# Patient Record
Sex: Female | Born: 1940 | Race: White | Hispanic: No | Marital: Married | Smoking: Never smoker
Health system: Southern US, Community
[De-identification: ages and names within clinical notes are randomized; demographics above are authoritative.]

## PROBLEM LIST (undated history)

## (undated) DIAGNOSIS — G8929 Other chronic pain: Secondary | ICD-10-CM

## (undated) DIAGNOSIS — E7800 Pure hypercholesterolemia, unspecified: Secondary | ICD-10-CM

## (undated) DIAGNOSIS — M549 Dorsalgia, unspecified: Secondary | ICD-10-CM

## (undated) DIAGNOSIS — M1990 Unspecified osteoarthritis, unspecified site: Secondary | ICD-10-CM

## (undated) HISTORY — PX: CHOLECYSTECTOMY: SHX55

## (undated) HISTORY — PX: BACK SURGERY: SHX140

## (undated) HISTORY — PX: EYE SURGERY: SHX253

## (undated) HISTORY — PX: ABDOMINAL HYSTERECTOMY: SHX81

---

## 2004-09-30 ENCOUNTER — Ambulatory Visit: Payer: Self-pay | Admitting: Unknown Physician Specialty

## 2005-11-10 ENCOUNTER — Ambulatory Visit: Payer: Self-pay | Admitting: Unknown Physician Specialty

## 2006-12-12 ENCOUNTER — Ambulatory Visit: Payer: Self-pay | Admitting: Unknown Physician Specialty

## 2008-01-01 ENCOUNTER — Ambulatory Visit: Payer: Self-pay | Admitting: Unknown Physician Specialty

## 2008-02-28 ENCOUNTER — Ambulatory Visit: Payer: Self-pay | Admitting: Gastroenterology

## 2009-02-06 ENCOUNTER — Ambulatory Visit: Payer: Self-pay | Admitting: Unknown Physician Specialty

## 2009-06-02 ENCOUNTER — Ambulatory Visit: Payer: Self-pay | Admitting: Unknown Physician Specialty

## 2009-09-02 ENCOUNTER — Ambulatory Visit: Payer: Self-pay | Admitting: Unknown Physician Specialty

## 2009-09-03 ENCOUNTER — Emergency Department: Payer: Self-pay | Admitting: Emergency Medicine

## 2009-09-23 ENCOUNTER — Ambulatory Visit: Payer: Self-pay | Admitting: Unknown Physician Specialty

## 2009-09-28 ENCOUNTER — Emergency Department: Payer: Self-pay | Admitting: Emergency Medicine

## 2010-01-26 ENCOUNTER — Ambulatory Visit: Payer: Self-pay | Admitting: Ophthalmology

## 2010-02-09 ENCOUNTER — Ambulatory Visit: Payer: Self-pay | Admitting: Ophthalmology

## 2010-03-01 ENCOUNTER — Ambulatory Visit: Payer: Self-pay | Admitting: Unknown Physician Specialty

## 2010-03-23 ENCOUNTER — Ambulatory Visit: Payer: Self-pay | Admitting: Ophthalmology

## 2011-07-06 ENCOUNTER — Ambulatory Visit: Payer: Self-pay | Admitting: Unknown Physician Specialty

## 2012-07-23 ENCOUNTER — Ambulatory Visit: Payer: Self-pay | Admitting: Physician Assistant

## 2012-07-26 ENCOUNTER — Ambulatory Visit: Payer: Self-pay | Admitting: Physician Assistant

## 2013-07-29 ENCOUNTER — Ambulatory Visit: Payer: Self-pay | Admitting: Physician Assistant

## 2013-10-28 ENCOUNTER — Ambulatory Visit: Payer: Self-pay | Admitting: Physician Assistant

## 2013-12-06 ENCOUNTER — Ambulatory Visit: Payer: Self-pay | Admitting: Physician Assistant

## 2013-12-16 DIAGNOSIS — M5416 Radiculopathy, lumbar region: Secondary | ICD-10-CM | POA: Insufficient documentation

## 2013-12-16 DIAGNOSIS — M51369 Other intervertebral disc degeneration, lumbar region without mention of lumbar back pain or lower extremity pain: Secondary | ICD-10-CM | POA: Insufficient documentation

## 2014-06-05 DIAGNOSIS — M8580 Other specified disorders of bone density and structure, unspecified site: Secondary | ICD-10-CM | POA: Insufficient documentation

## 2014-08-05 ENCOUNTER — Ambulatory Visit: Payer: Self-pay | Admitting: Physician Assistant

## 2015-07-24 ENCOUNTER — Emergency Department
Admission: EM | Admit: 2015-07-24 | Discharge: 2015-07-24 | Disposition: A | Discharge status: Home / Self Care | Payer: Medicare Other | Attending: Emergency Medicine | Admitting: Emergency Medicine

## 2015-07-24 ENCOUNTER — Encounter: Payer: Self-pay | Admitting: Emergency Medicine

## 2015-07-24 ENCOUNTER — Emergency Department: Payer: Medicare Other

## 2015-07-24 DIAGNOSIS — Y9389 Activity, other specified: Secondary | ICD-10-CM | POA: Diagnosis not present

## 2015-07-24 DIAGNOSIS — S0181XA Laceration without foreign body of other part of head, initial encounter: Secondary | ICD-10-CM

## 2015-07-24 DIAGNOSIS — S8001XA Contusion of right knee, initial encounter: Secondary | ICD-10-CM | POA: Diagnosis not present

## 2015-07-24 DIAGNOSIS — Y92009 Unspecified place in unspecified non-institutional (private) residence as the place of occurrence of the external cause: Secondary | ICD-10-CM | POA: Diagnosis not present

## 2015-07-24 DIAGNOSIS — S01112A Laceration without foreign body of left eyelid and periocular area, initial encounter: Secondary | ICD-10-CM | POA: Diagnosis not present

## 2015-07-24 DIAGNOSIS — W07XXXA Fall from chair, initial encounter: Secondary | ICD-10-CM | POA: Insufficient documentation

## 2015-07-24 DIAGNOSIS — Y998 Other external cause status: Secondary | ICD-10-CM | POA: Insufficient documentation

## 2015-07-24 DIAGNOSIS — S0093XA Contusion of unspecified part of head, initial encounter: Secondary | ICD-10-CM

## 2015-07-24 DIAGNOSIS — W08XXXA Fall from other furniture, initial encounter: Secondary | ICD-10-CM

## 2015-07-24 HISTORY — DX: Unspecified osteoarthritis, unspecified site: M19.90

## 2015-07-24 HISTORY — DX: Dorsalgia, unspecified: M54.9

## 2015-07-24 HISTORY — DX: Other chronic pain: G89.29

## 2015-07-24 HISTORY — DX: Pure hypercholesterolemia, unspecified: E78.00

## 2015-07-24 MED ORDER — LIDOCAINE-EPINEPHRINE (PF) 1 %-1:200000 IJ SOLN
30.0000 mL | Freq: Once | INTRAMUSCULAR | Status: DC
  Filled 2015-07-24: qty 30

## 2015-07-24 NOTE — ED Notes (Signed)
Standing in chair and fell off of chair, has lac to left forehead, no loc

## 2015-07-24 NOTE — ED Notes (Signed)
Pt was standing on chair trying to hang curtain and lost balance.  Hit head and right knee.  Pain to both. No LOC.  No blood thinners.

## 2015-07-24 NOTE — Discharge Instructions (Signed)
Facial Laceration ° A facial laceration is a cut on the face. These injuries can be painful and cause bleeding. Lacerations usually heal quickly, but they need special care to reduce scarring. °DIAGNOSIS  °Your health care provider will take a medical history, ask for details about how the injury occurred, and examine the wound to determine how deep the cut is. °TREATMENT  °Some facial lacerations may not require closure. Others may not be able to be closed because of an increased risk of infection. The risk of infection and the chance for successful closure will depend on various factors, including the amount of time since the injury occurred. °The wound may be cleaned to help prevent infection. If closure is appropriate, pain medicines may be given if needed. Your health care provider will use stitches (sutures), wound glue (adhesive), or skin adhesive strips to repair the laceration. These tools bring the skin edges together to allow for faster healing and a better cosmetic outcome. If needed, you may also be given a tetanus shot. °HOME CARE INSTRUCTIONS °· Only take over-the-counter or prescription medicines as directed by your health care provider. °· Follow your health care provider's instructions for wound care. These instructions will vary depending on the technique used for closing the wound. °For Sutures: °· Keep the wound clean and dry.   °· If you were given a bandage (dressing), you should change it at least once a day. Also change the dressing if it becomes wet or dirty, or as directed by your health care provider.   °· Wash the wound with soap and water 2 times a day. Rinse the wound off with water to remove all soap. Pat the wound dry with a clean towel.   °· After cleaning, apply a thin layer of the antibiotic ointment recommended by your health care provider. This will help prevent infection and keep the dressing from sticking.   °· You may shower as usual after the first 24 hours. Do not soak the  wound in water until the sutures are removed.   °· Get your sutures removed as directed by your health care provider. With facial lacerations, sutures should usually be taken out after 4-5 days to avoid stitch marks.   °· Wait a few days after your sutures are removed before applying any makeup. °For Skin Adhesive Strips: °· Keep the wound clean and dry.   °· Do not get the skin adhesive strips wet. You may bathe carefully, using caution to keep the wound dry.   °· If the wound gets wet, pat it dry with a clean towel.   °· Skin adhesive strips will fall off on their own. You may trim the strips as the wound heals. Do not remove skin adhesive strips that are still stuck to the wound. They will fall off in time.   °For Wound Adhesive: °· You may briefly wet your wound in the shower or bath. Do not soak or scrub the wound. Do not swim. Avoid periods of heavy sweating until the skin adhesive has fallen off on its own. After showering or bathing, gently pat the wound dry with a clean towel.   °· Do not apply liquid medicine, cream medicine, ointment medicine, or makeup to your wound while the skin adhesive is in place. This may loosen the film before your wound is healed.   °· If a dressing is placed over the wound, be careful not to apply tape directly over the skin adhesive. This may cause the adhesive to be pulled off before the wound is healed.   °· Avoid   prolonged exposure to sunlight or tanning lamps while the skin adhesive is in place.  The skin adhesive will usually remain in place for 5-10 days, then naturally fall off the skin. Do not pick at the adhesive film.  After Healing: Once the wound has healed, cover the wound with sunscreen during the day for 1 full year. This can help minimize scarring. Exposure to ultraviolet light in the first year will darken the scar. It can take 1-2 years for the scar to lose its redness and to heal completely.  SEEK MEDICAL CARE IF:  You have a fever. SEEK IMMEDIATE  MEDICAL CARE IF:  You have redness, pain, or swelling around the wound.   You see ayellowish-white fluid (pus) coming from the wound.    This information is not intended to replace advice given to you by your health care provider. Make sure you discuss any questions you have with your health care provider.   Document Released: 08/25/2004 Document Revised: 08/08/2014 Document Reviewed: 02/28/2013 Elsevier Interactive Patient Education 2016 Elsevier Inc.  Facial or Scalp Contusion  A facial or scalp contusion is a deep bruise on the face or head. Contusions happen when an injury causes bleeding under the skin. Signs of bruising include pain, puffiness (swelling), and discolored skin. The contusion may turn blue, purple, or yellow. HOME CARE  Only take medicines as told by your doctor.  Put ice on the injured area.  Put ice in a plastic bag.  Place a towel between your skin and the bag.  Leave the ice on for 20 minutes, 2-3 times a day. GET HELP IF:  You have bite problems.  You have pain when chewing.  You are worried about your face not healing normally. GET HELP RIGHT AWAY IF:   You have severe pain or a headache and medicine does not help.  You are very tired or confused, or your personality changes.  You throw up (vomit).  You have a nosebleed that will not stop.  You see two of everything (double vision) or have blurry vision.  You have fluid coming from your nose or ear.  You have problems walking or using your arms or legs. MAKE SURE YOU:   Understand these instructions.  Will watch your condition.  Will get help right away if you are not doing well or get worse.   This information is not intended to replace advice given to you by your health care provider. Make sure you discuss any questions you have with your health care provider.   Document Released: 07/07/2011 Document Revised: 08/08/2014 Document Reviewed: 02/28/2013 Elsevier Interactive Patient  Education 2016 [employer].  Stitches, [place], or Adhesive Wound Closure Doctors use stitches (sutures), staples, and certain glue (skin adhesives) to hold your skin together while it heals (wound closure). You may need this treatment after you have surgery or if you cut your skin accidentally. These methods help your skin heal more quickly. They also make it less likely that you will have a scar. WHAT ARE THE DIFFERENT KINDS OF WOUND CLOSURES? There are many options for wound closure. The one that your doctor uses depends on how deep and large your wound is. Adhesive Glue To use this glue to close a wound, your doctor holds the edges of the wound together and paints the glue on the surface of your skin. You may need more than one layer of glue. Then the wound may be covered with a light bandage (dressing). This type of skin closure  may be used for small wounds that are not deep (superficial). Using glue for wound closure is less painful than other methods. It does not require a medicine that numbs the area. This method also leaves nothing to be removed. Adhesive glue is often used for children and on facial wounds. Adhesive glue cannot be used for wounds that are deep, uneven, or bleeding. It is not used inside of a wound.  Adhesive Strips These strips are made of sticky (adhesive), porous paper. They are placed across your skin edges like a regular adhesive bandage. You leave them on until they fall off. Adhesive strips may be used to close very superficial wounds. They may also be used along with sutures to improve closure of your skin edges.  Sutures Sutures are the oldest method of wound closure. Sutures can be made from natural or synthetic materials. They can be made from a material that your body can break down as your wound heals (absorbable), or they can be made from a material that needs to be removed from your skin (nonabsorbable). They come in many different strengths and sizes. Your  doctor attaches the sutures to a steel needle on one end. Sutures can be passed through your skin, or through the tissues beneath your skin. Then they are tied and cut. Your skin edges may be closed in one continuous stitch or in separate stitches. Sutures are strong and can be used for all kinds of wounds. Absorbable sutures may be used to close tissues under the skin. The disadvantage of sutures is that they may cause skin reactions that lead to infection. Nonabsorbable sutures need to be removed. Staples When surgical staples are used to close a wound, the edges of your skin on both sides of the wound are brought close together. A staple is placed across the wound, and an instrument secures the edges together. Staples are often used to close surgical cuts (incisions). Staples are faster to use than sutures, and they cause less reaction from your skin. Staples need to be removed using a tool that bends the staples away from your skin. HOW DO I CARE FOR MY WOUND CLOSURE?  Take medicines only as told by your doctor.  If you were prescribed an antibiotic medicine for your wound, finish it all even if you start to feel better.  Use ointments or creams only as told by your doctor.  Wash your hands with soap and water before and after touching your wound.  Do not soak your wound in water. Do not take baths, swim, or use a hot tub until your doctor says it is okay.  Ask your doctor when you can start showering. Cover your wound if told by your doctor.  Do not take out your own sutures or staples.  Do not pick at your wound. Picking can cause an infection.  Keep all follow-up visits as told by your doctor. This is important. HOW LONG WILL I HAVE MY WOUND CLOSURE?   Leave adhesive glue on your skin until the glue peels away.  Leave adhesive strips on your skin until they fall off.  Absorbable sutures will dissolve within several days.  Nonabsorbable sutures and staples must be removed. The  location of the wound will determine how long they stay in. This can range from several days to a couple of weeks. WHEN SHOULD I SEEK HELP FOR MY WOUND CLOSURE? Contact your doctor if:  You have a fever.  You have chills.  You have redness, puffiness (  swelling), or pain at the site of your wound.  You have fluid, blood, or pus coming from your wound.  There is a bad smell coming from your wound.  The skin edges of your wound start to separate after your sutures have been removed.  Your wound becomes thick, raised, and darker in color after your sutures come out (scarring).   This information is not intended to replace advice given to you by your health care provider. Make sure you discuss any questions you have with your health care provider.   Document Released: 05/15/2009 Document Revised: 08/08/2014 Document Reviewed: 12/25/2013 Elsevier Interactive Patient Education [employer] Elsevier Inc.

## 2015-07-24 NOTE — ED Provider Notes (Signed)
[facility] Emergency Department Provider Note ____________________________________________  Time seen: 1345   I have reviewed the triage vital signs and the nursing notes.  HISTORY  Chief Complaint  Fall  HPI Stacy Daniels is a 74 y.o. female reports to the ED for evaluation of injury sustained on a fall at home. She is accompanied by her adult daughter, for evaluation of a laceration over her left brow. She also notes some pain and swelling to the right knee. The injury occurred at that the patient lost her footing and the chair she was standing in. She was at home attempting to hang some curtains she had recently washed. She describes falling hitting her forehead on the floor. She denies loss of consciousness, nausea, vomiting, or dizziness. She medially called her husband who came home to attend to her. She was able to ambulate to the bathroom to find a washcloth to place over her brow. She is here for evaluation of injuries, and notes overall discomfort at 5/10 in triage.  Past Medical History  Diagnosis Date  . Hypercholesteremia   . Chronic back pain   . Arthritis    There are no active problems to display for this patient.  Past Surgical History  Procedure Laterality Date  . Back surgery    . Cholecystectomy    . Abdominal hysterectomy    . Eye surgery     No current outpatient prescriptions on file.  Allergies Other and Valtrex  History reviewed. No pertinent family history.  Social History Social History  Substance Use Topics  . Smoking status: Never Smoker   . Smokeless tobacco: None  . Alcohol Use: No   Review of Systems  Constitutional: Negative for fever. Eyes: Negative for visual changes. ENT: Negative for sore throat. Cardiovascular: Negative for chest pain. Respiratory: Negative for shortness of breath. Gastrointestinal: Negative for abdominal pain, vomiting and diarrhea. Genitourinary: Negative for  dysuria. Musculoskeletal: Negative for back pain. Skin: Negative for rash. Neurological: Negative for headaches, focal weakness or numbness. ____________________________________________  PHYSICAL EXAM:  VITAL SIGNS: ED Triage Vitals  Enc Vitals Group     BP 07/24/15 1257 158/78 mmHg     Pulse Rate 07/24/15 1257 78     Resp 07/24/15 1257 18     Temp 07/24/15 1257 97.7 F (36.5 C)     Temp Source 07/24/15 1257 Oral     SpO2 07/24/15 1257 100 %     Weight 07/24/15 1257 172 lb (78.019 kg)     Height 07/24/15 1257  (1.6 m)     Head Cir --      Peak Flow --      Pain Score 07/24/15 1259 5     Pain Loc --      Pain Edu? --      Excl. in GC? --    Constitutional: Alert and oriented. Well appearing and in no distress. Head: Normocephalic and atraumatic, except for a 2-1/2 cm laceration over the left brow..      Eyes: Conjunctivae are normal. PERRL. Normal extraocular movements      Ears: Canals clear. TMs intact bilaterally.   Nose: No congestion/rhinorrhea.   Mouth/Throat: Mucous membranes are moist.   Neck: Supple. No thyromegaly. Hematological/Lymphatic/Immunological: No cervical lymphadenopathy. Cardiovascular: Normal rate, regular rhythm.  Respiratory: Normal respiratory effort. No wheezes/rales/rhonchi. Gastrointestinal: Soft and nontender. No distention. Musculoskeletal: The right knee with obvious soft tissue swelling noted inferiorly and laterally. Patient with normal flexion and extension range of the  knee without deficit. She is able to transition from sitting to standing without difficulty. No popliteal space fullness, or Achilles tenderness is noted. Nontender with normal range of motion in all extremities.  Neurologic:  Cranial nerves II through XII grossly intact. Normal gait without ataxia. Normal speech and language. No gross focal neurologic deficits are appreciated. The patient responds appropriately to questions and is well oriented to the events prior  to and immediately following the accident. GCS score = 15 Skin:  Skin is warm, dry and intact. No rash noted. Psychiatric: Mood and affect are normal. Patient exhibits appropriate insight and judgment. ____________________________________________   RADIOLOGY  Right Knee IMPRESSION: No acute fracture or dislocation identified about the right Knee.  Anterior soft tissue bruising.  Three compartment mild osteoarthritic changes.  ____________________________________________  PROCEDURES  LACERATION REPAIR Performed by: Lissa HoardMenshew, Deondrea Aguado V Bacon Authorized by: Lissa HoardMenshew, Mandolin Falwell V Bacon Consent: Verbal consent obtained. Risks and benefits: risks, benefits and alternatives were discussed Consent given by: patient Patient identity confirmed: provided demographic data Prepped and Draped in normal sterile fashion Wound explored  Laceration Location: left brow  Laceration Length: 3cm  No Foreign Bodies seen or palpated  Anesthesia: local infiltration  Local anesthetic: lidocaine 1% w/ epinephrine  Anesthetic total: 2 ml  Irrigation method: syringe Amount of cleaning: standard  Skin closure: 6-0 vicryl / wound adhesive  Number of sutures: 1  Technique: running subcutaneous   Patient tolerance: Patient tolerated the procedure well with no immediate complications. ____________________________________________  INITIAL IMPRESSION / ASSESSMENT AND PLAN / ED COURSE  Patient with a mechanical fall at home out of the chair, which led to a facial contusion and laceration as well as a right knee contusion. She has a normal neurological exam without deficit. No indication of any intracranial process on exam. She also has radiology confirmation of knee contusion without fracture dislocation. ____________________________________________  FINAL CLINICAL IMPRESSION(S) / ED DIAGNOSES  Final diagnoses:  Fall from furniture, initial encounter  Head contusion, initial encounter  Facial  laceration, initial encounter  Knee contusion, right, initial encounter      Lissa HoardJenise V Bacon Marvetta Vohs, PA-C 07/24/15 1616  Jennye MoccasinBrian S Quigley, MD 08/09/15 [phone]

## 2015-07-28 ENCOUNTER — Other Ambulatory Visit: Payer: Self-pay | Admitting: Physician Assistant

## 2015-07-28 DIAGNOSIS — Z1231 Encounter for screening mammogram for malignant neoplasm of breast: Secondary | ICD-10-CM

## 2015-08-07 ENCOUNTER — Other Ambulatory Visit: Payer: Self-pay | Admitting: Physician Assistant

## 2015-08-07 ENCOUNTER — Ambulatory Visit
Admission: RE | Admit: 2015-08-07 | Discharge: 2015-08-07 | Disposition: A | Discharge status: Home / Self Care | Payer: Medicare Other | Source: Ambulatory Visit | Attending: Physician Assistant | Admitting: Physician Assistant

## 2015-08-07 DIAGNOSIS — Z1231 Encounter for screening mammogram for malignant neoplasm of breast: Secondary | ICD-10-CM

## 2015-10-06 ENCOUNTER — Emergency Department
Admission: EM | Admit: 2015-10-06 | Discharge: 2015-10-06 | Disposition: A | Discharge status: Home / Self Care | Payer: Medicare Other | Attending: Emergency Medicine | Admitting: Emergency Medicine

## 2015-10-06 DIAGNOSIS — F419 Anxiety disorder, unspecified: Secondary | ICD-10-CM | POA: Insufficient documentation

## 2015-10-06 DIAGNOSIS — R251 Tremor, unspecified: Secondary | ICD-10-CM | POA: Diagnosis not present

## 2015-10-06 DIAGNOSIS — R450 Nervousness: Secondary | ICD-10-CM | POA: Diagnosis not present

## 2015-10-06 DIAGNOSIS — R110 Nausea: Secondary | ICD-10-CM | POA: Insufficient documentation

## 2015-10-06 DIAGNOSIS — R208 Other disturbances of skin sensation: Secondary | ICD-10-CM | POA: Insufficient documentation

## 2015-10-06 DIAGNOSIS — Z79899 Other long term (current) drug therapy: Secondary | ICD-10-CM | POA: Diagnosis not present

## 2015-10-06 DIAGNOSIS — T43215A Adverse effect of selective serotonin and norepinephrine reuptake inhibitors, initial encounter: Secondary | ICD-10-CM | POA: Diagnosis not present

## 2015-10-06 DIAGNOSIS — T50905A Adverse effect of unspecified drugs, medicaments and biological substances, initial encounter: Secondary | ICD-10-CM

## 2015-10-06 MED ORDER — ONDANSETRON 4 MG PO TBDP
4.0000 mg | ORAL_TABLET | Freq: Once | ORAL | Status: AC
  Administered 2015-10-06: 4 mg via ORAL
  Filled 2015-10-06: qty 1

## 2015-10-06 MED ORDER — HYDROXYZINE HCL 25 MG PO TABS
25.0000 mg | ORAL_TABLET | Freq: Once | ORAL | Status: AC
  Administered 2015-10-06: 25 mg via ORAL
  Filled 2015-10-06: qty 1

## 2015-10-06 NOTE — Discharge Instructions (Signed)
Drug Allergy °Allergic reactions to medicines are common. Some allergic reactions are mild. A delayed type of drug allergy that occurs 1 week or more after exposure to a medicine or vaccine is called serum sickness. A life-threatening, sudden (acute) allergic reaction that involves the whole body is called anaphylaxis. °CAUSES  °"True" drug allergies occur when there is an allergic reaction to a medicine. This is caused by overactivity of the immune system. First, the body becomes sensitized. The immune system is triggered by your first exposure to the medicine. Following this first exposure, future exposure to the same medicine may be life-threatening. °Almost any medicine can cause an allergic reaction. Common ones are: °· Penicillin. °· Sulfonamides (sulfa drugs). °· Local anesthetics. °· X-ray dyes that contain iodine. °SYMPTOMS  °Common symptoms of a minor allergic reaction are: °· Swelling around the mouth. °· An itchy red rash or hives. °· Vomiting or diarrhea. °Anaphylaxis can cause swelling of the mouth and throat. This makes it difficult to breathe and swallow. Severe reactions can be fatal within seconds, even after exposure to only a trace amount of the drug that causes the reaction. °HOME CARE INSTRUCTIONS °· If you are unsure of what caused your reaction, write down: °¨ The names of the medicines you took. °¨ How much medicine you took. °¨ How you took the medicine, such as whether you took a pill, injected the medicine, or applied it to your skin. °¨ All of the things you ate and drank. °¨ The date and time of your reaction. °¨ The symptoms of the reaction. °· You may want to follow up with an allergy specialist after the reaction has cleared in order to be tested to confirm the allergy. It is important to confirm that your reaction is an allergy, not just a side effect to the medicine. If you have a true allergy to a medicine, this may prevent that medicine and related medicines from being given to  you when you are very ill. °· If you have hives or a rash: °¨ Take medicines as directed by your caregiver. °¨ You may use an over-the-counter antihistamine (diphenhydramine) as needed. °¨ Apply cold compresses to the skin or take baths in cool water. Avoid hot baths or showers. °· If you are severely allergic: °¨ Continuous observation after a severe reaction may be needed. Hospitalization is often required. °¨ Wear a medical alert bracelet or necklace stating your allergy. °¨ You and your family must learn how to use an anaphylaxis kit or give an epinephrine injection to temporarily treat an emergency allergic reaction. If you have had a severe reaction, always carry your epinephrine injection or anaphylaxis kit with you. This can be lifesaving if you have a severe reaction. °· Do not drive or perform tasks after treatment until the medicines used to treat your reaction have worn off, or until your caregiver says it is okay. °· If you have a drug allergy that was confirmed by your health care provider: °¨ Carry information about the drug allergy with you at all times. °¨ Always check with a pharmacist before taking any over-the-counter medicine. °SEEK MEDICAL CARE IF:  °· You think you had an allergic reaction. Symptoms usually start within 30 minutes after exposure. °· Symptoms are getting worse rather than better. °· You develop new symptoms. °· The symptoms that brought you to your caregiver return. °SEEK IMMEDIATE MEDICAL CARE IF:  °· You have swelling of the mouth, difficulty breathing, or wheezing. °· You have a tight   feeling in your chest or throat.  You develop hives, swelling, or itching all over your body.  You develop severe vomiting or diarrhea.  You feel faint or pass out. This is an emergency. Use your epinephrine injection or anaphylaxis kit as you have been instructed. Call for emergency medical help. Even if you improve after the injection, you need to be examined at a hospital emergency  department. MAKE SURE YOU:   Understand these instructions.  Will watch your condition.  Will get help right away if you are not doing well or get worse.   This information is not intended to replace advice given to you by your health care provider. Make sure you discuss any questions you have with your health care provider.   Document Released: 07/18/2005 Document Revised: 08/08/2014 Document Reviewed: 02/17/2015 Elsevier Interactive Patient Education [employer].

## 2015-10-06 NOTE — ED Provider Notes (Signed)
[facility] Emergency Department Provider Note  ____________________________________________  Time seen: Approximately 6:08 PM  I have reviewed the triage vital signs and the nursing notes.   HISTORY  Chief Complaint Medication Reaction    HPI Stacy Daniels is a 75 y.o. female , NAD, presents to the emergency department with her husband who assists with history. States she has felt a "burning sensation inside her body", shakiness, nervousness and nausea since yesterday evening. Took her first dose of Cymbalta, which is a new medication for her, prior to the onset of her symptoms. She also takes Celebrex and Neurontin on a regular basis in which she had taken at her usual times yesterday. Took both Celebrex and Neurontin this morning and contacted her primary physician's office. She was instructed not to take any further medications until contacted again. States she never heard back from that medical office and presents here tonight. Continues with similar symptoms as she did last night. Denies chest pain, shortness of breath, numbness, weakness, tingling. Has had no headaches, changes in vision. Denies nausea, vomiting, diarrhea. Denies myalgias.Has no rash, hives, swelling, difficulty breathing or swallowing.    Past Medical History  Diagnosis Date  . Hypercholesteremia   . Chronic back pain   . Arthritis     There are no active problems to display for this patient.   Past Surgical History  Procedure Laterality Date  . Back surgery    . Cholecystectomy    . Abdominal hysterectomy    . Eye surgery      Current Outpatient Rx  Name  Route  Sig  Dispense  Refill  . celecoxib (CELEBREX) 200 MG capsule   Oral   Take 200 mg by mouth 2 (two) times daily.         Marland Kitchen. gabapentin (NEURONTIN) 300 MG capsule   Oral   Take 300 mg by mouth at bedtime.           Allergies Other and Valtrex  Family History  Problem Relation Age of Onset  . Breast  cancer Other     Social History Social History  Substance Use Topics  . Smoking status: Never Smoker   . Smokeless tobacco: None  . Alcohol Use: No     Review of Systems  Constitutional: No fever/chills, fatigue.  Eyes: No visual changes. ENT: No swelling throat, difficulty swallowing. Cardiovascular: No chest pain, palpitations. Respiratory: No cough. No shortness of breath. No wheezing.  Gastrointestinal: No abdominal pain.  No nausea, vomiting.  Musculoskeletal: Negative for back pain.  Skin: Positive for burning sensation. Negative for rash, hives, swelling. Neurological: Negative for headaches, focal weakness or numbness. No tingling.  Psychological:  Positive anxiety, nervous feeling 10-point ROS otherwise negative.  ____________________________________________   PHYSICAL EXAM:  VITAL SIGNS: ED Triage Vitals  Enc Vitals Group     BP 10/06/15 1750 162/66 mmHg     Pulse Rate 10/06/15 1750 70     Resp 10/06/15 1750 16     Temp 10/06/15 1751 97.6 F (36.4 C)     Temp Source 10/06/15 1750 Oral     SpO2 10/06/15 1750 96 %     Weight 10/06/15 1750 158 lb (71.668 kg)     Height 10/06/15 1750 5\' 5"  (1.651 m)     Head Cir --      Peak Flow --      Pain Score --      Pain Loc --      Pain Edu? --  Excl. in GC? --     Constitutional: Alert and oriented. Well appearing and in no acute distress. Eyes: Conjunctivae are normal. PERRL. EOMI without pain.  Head: Atraumatic. Neck: No stridor.Supple with FROM Hematological/Lymphatic/Immunilogical: No cervical lymphadenopathy. Cardiovascular: Normal rate, regular rhythm. Normal S1 and S2.  Good peripheral circulation. Respiratory: Normal respiratory effort without tachypnea or retractions. Lungs CTAB. Neurologic:  Normal speech and language. No gross focal neurologic deficits are appreciated. Mildly shaking hands with intentional movement. Skin:  Skin is warm, dry and intact. No rash noted. Psychiatric: Mood and affect  are normal. Speech and behavior are normal. Patient exhibits appropriate insight and judgement.   ____________________________________________   LABS  None  ____________________________________________  EKG  None ____________________________________________  RADIOLOGY  None ____________________________________________    PROCEDURES  Procedure(s) performed: None    Medications  ondansetron (ZOFRAN-ODT) disintegrating tablet 4 mg (4 mg Oral Given 10/06/15 1816)  hydrOXYzine (ATARAX/VISTARIL) tablet 25 mg (25 mg Oral Given 10/06/15 1817)   ----------------------------------------- 6:46 PM on 10/06/2015 -----------------------------------------  Patient notes improvement of symptoms since being given medications.   ____________________________________________   INITIAL IMPRESSION / ASSESSMENT AND PLAN / ED COURSE  Patient's diagnosis is consistent with medication side effects. According to Up to Date, the patient is experiencing some of the common side effects to Cymbalta. Patient given Vistaril and Zofran while in the ED with improvement of symptoms. Advise that the patient continue to avoid the Cymbalta until she can follow up with the prescribing physician. Patient notes she has promethazine at home, and may take that medication as needed. Patient advised to contact her PCP in the morning to scheduled follow up. Patient is given ED precautions to return to the ED for any worsening or new symptoms.   ____________________________________________  FINAL CLINICAL IMPRESSION(S) / ED DIAGNOSES  Final diagnoses:  Medication side effect, initial encounter      NEW MEDICATIONS STARTED DURING THIS VISIT:  New Prescriptions   No medications on file         Hope Pigeon, PA-C 10/06/15 1846  Governor Rooks, MD 10/06/15 2041

## 2015-10-06 NOTE — ED Notes (Signed)
States she was started on Cymbalta yesterday  took her first dose at 5 pm.. Then developed a hot feeling all over .Caryl Pina.shakey  And some nausea ..Marland Kitchen

## 2015-10-06 NOTE — ED Notes (Signed)
Pt reports taking first dose of cymbalta yesterday; reports burning sensation inside skin, reports feeling "shaky", nervous, nausea.

## 2016-02-02 IMAGING — CT CT ABD-PELV W/ CM
2 of 5 series · 16 of 46 positions shown, 18 images · IV contrast (isovue)
Comparison: 09/23/2009

CLINICAL DATA: Left flank pain. Nausea. Diarrhea. Diverticulitis.

EXAM:
CT ABDOMEN AND PELVIS WITH CONTRAST
TECHNIQUE: Multidetector CT imaging of the abdomen and pelvis was performed
using the standard protocol following bolus administration of
intravenous contrast.
CONTRAST:  100 mL Isovue 370

[Series 2: routine with · axial · 0.79mm/px · z∈[-571,-176]mm · 13 of 89 slices shown, 15 images]
[im 5/89  soft-tissue]
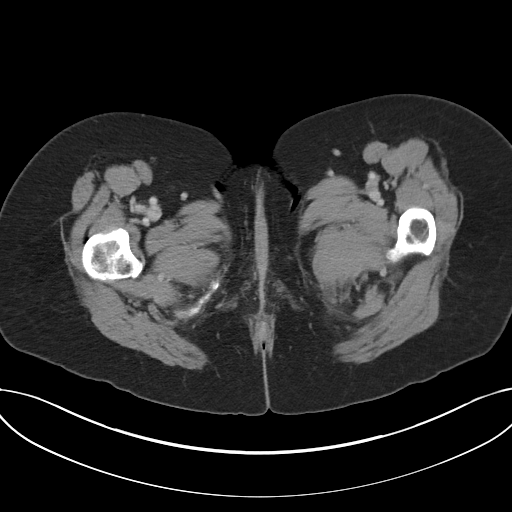
[im 5/89  bone]
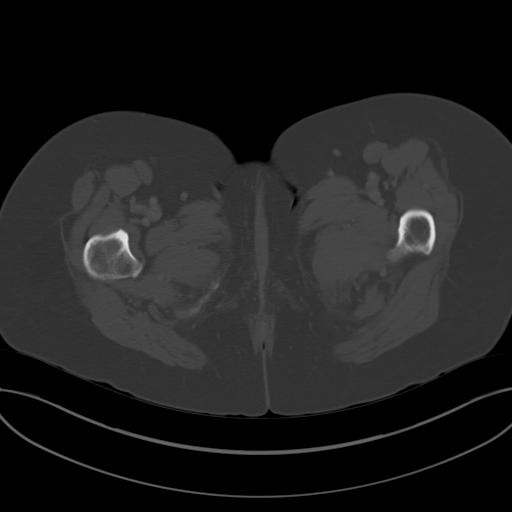
[im 13/89  soft-tissue]
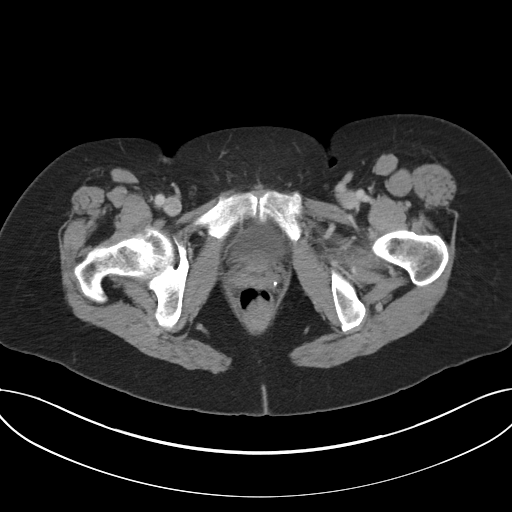
[im 17/89  soft-tissue]
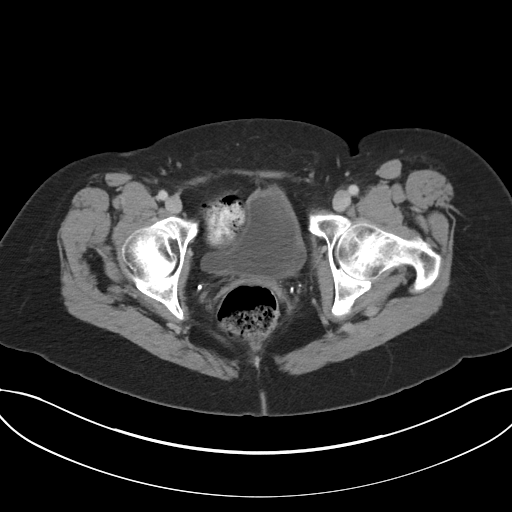
[im 26/89  soft-tissue]
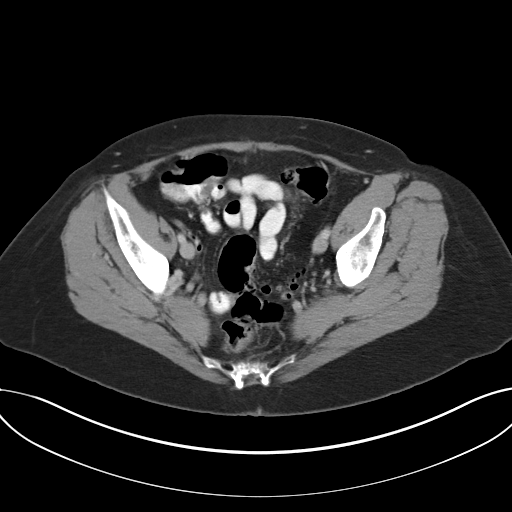
[im 30/89  soft-tissue]
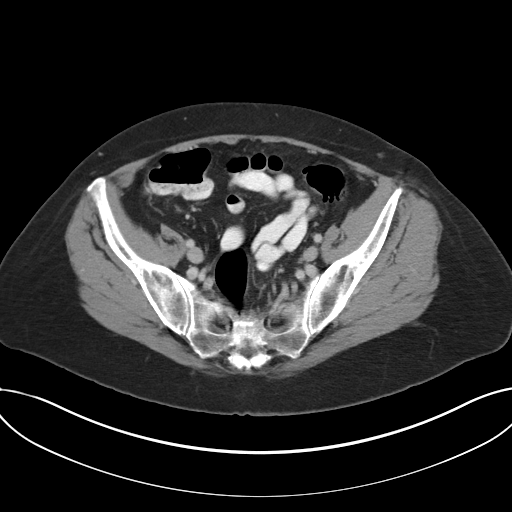
[im 38/89  soft-tissue]
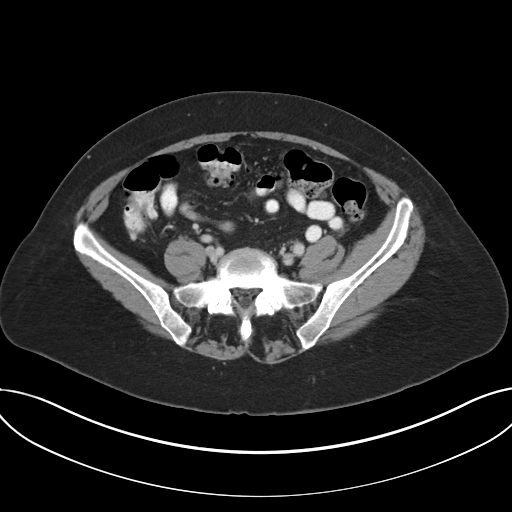
[im 47/89  soft-tissue]
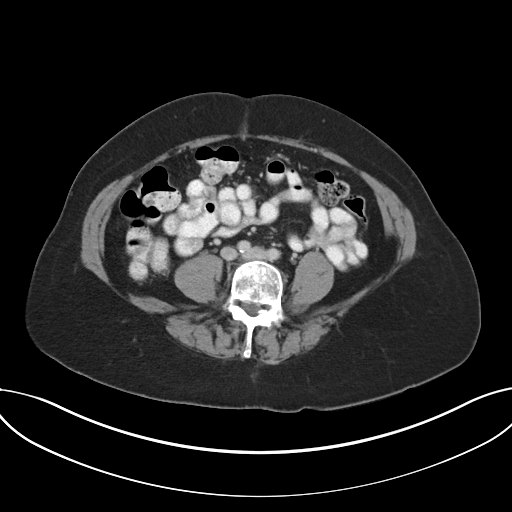
[im 51/89  soft-tissue]
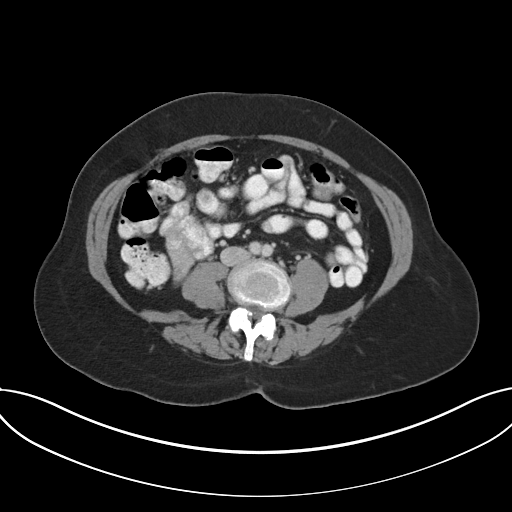
[im 59/89  soft-tissue]
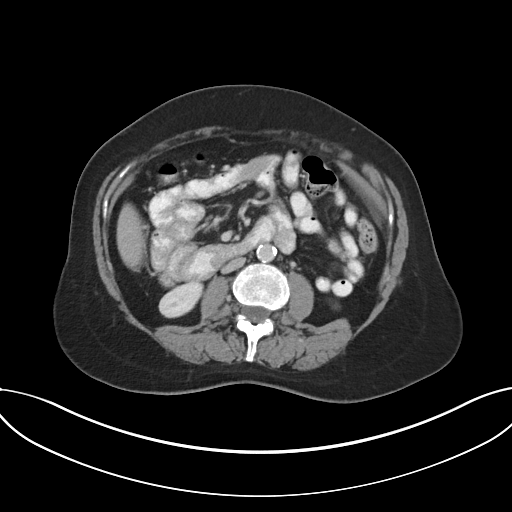
[im 59/89  bone]
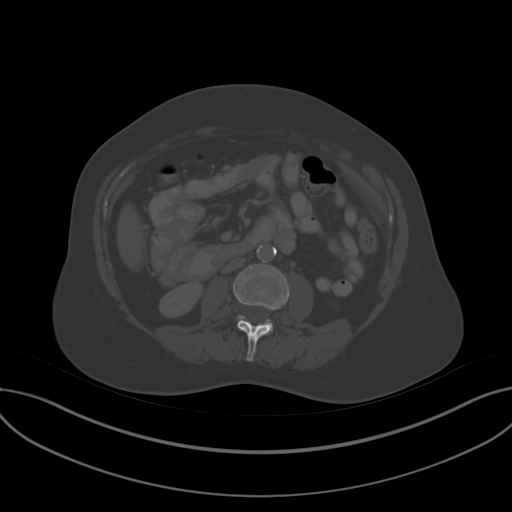
[im 63/89  soft-tissue]
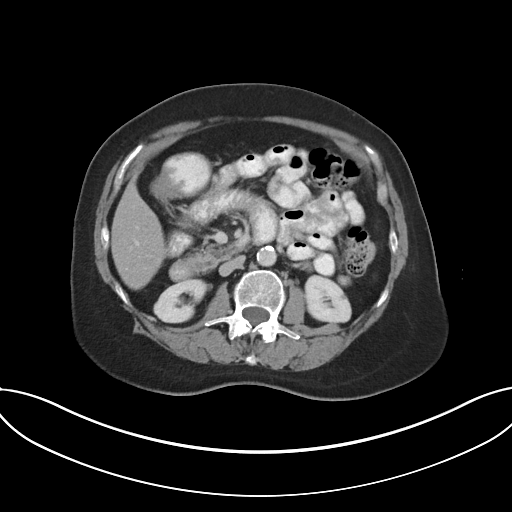
[im 72/89  soft-tissue]
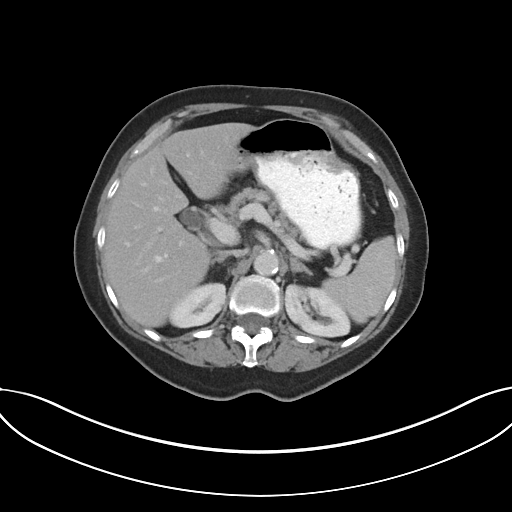
[im 76/89  soft-tissue]
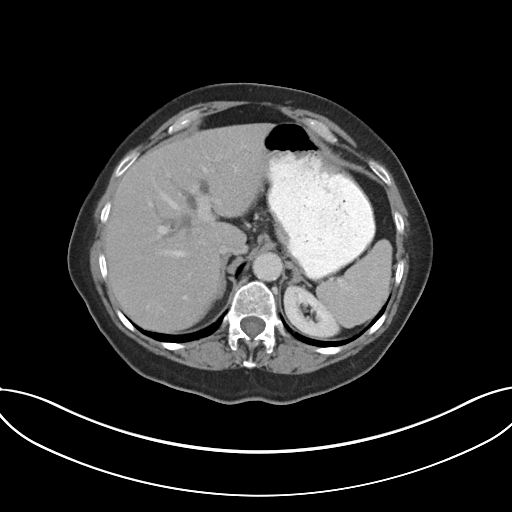
[im 84/89  soft-tissue]
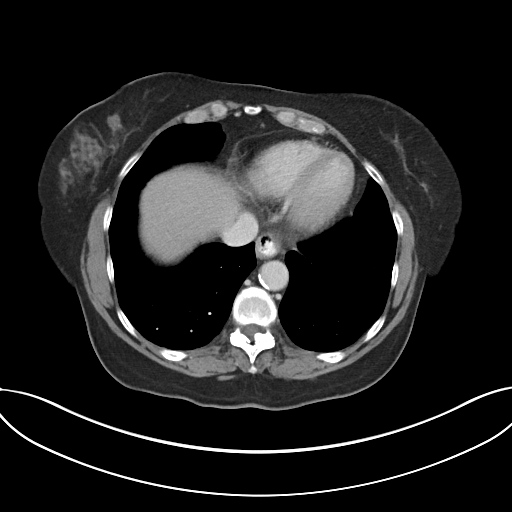

[Series 5: cor routine with · coronal · 0.71mm/px · 3 of 118 slices shown]
[im 40/118  soft-tissue]
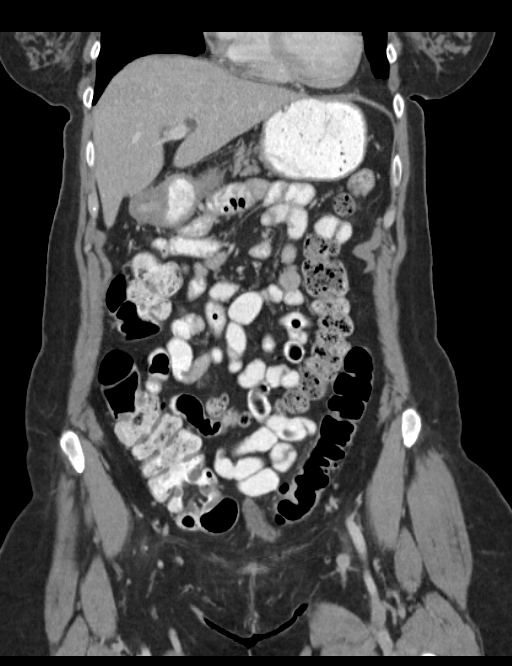
[im 53/118  soft-tissue]
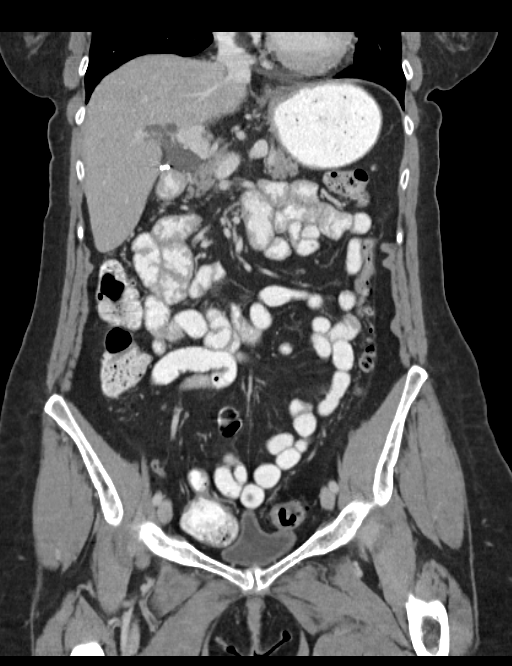
[im 66/118  soft-tissue]
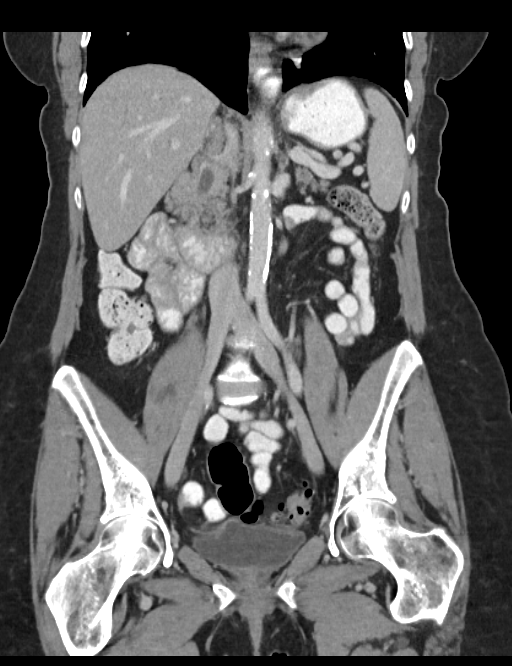

[16 of 46 positions shown; findings below may reference images not displayed]

FINDINGS: Prior cholecystectomy again noted. Mild biliary ductal dilatation is
stable. No liver masses are identified. Mild hepatic steatosis is
noted. No evidence of pancreatic mass or pancreatic ductal
dilatation.

The spleen and adrenal glands are normal in appearance. Kidneys are
unremarkable in appearance and there is no evidence hydronephrosis.
Prior hysterectomy noted. Adnexal regions are unremarkable. No soft
tissue masses or lymphadenopathy identified within the abdomen or
pelvis.

Diverticulosis is seen mainly involving the descending and sigmoid
colon, however there is no evidence of diverticulitis. No other
inflammatory process or abnormal fluid collections identified. No
evidence of bowel obstruction or hernia.
IMPRESSION: Diverticulosis. No radiographic evidence of diverticulitis or other
acute findings.

Stable biliary ductal dilatation, likely related to prior
cholecystectomy.

## 2016-07-01 ENCOUNTER — Other Ambulatory Visit: Payer: Self-pay | Admitting: Physician Assistant

## 2016-07-01 DIAGNOSIS — Z1231 Encounter for screening mammogram for malignant neoplasm of breast: Secondary | ICD-10-CM

## 2016-07-27 DIAGNOSIS — E559 Vitamin D deficiency, unspecified: Secondary | ICD-10-CM | POA: Insufficient documentation

## 2016-08-08 ENCOUNTER — Ambulatory Visit
Admission: RE | Admit: 2016-08-08 | Discharge: 2016-08-08 | Disposition: A | Discharge status: Home / Self Care | Payer: Medicare Other | Source: Ambulatory Visit | Attending: Physician Assistant | Admitting: Physician Assistant

## 2016-08-08 DIAGNOSIS — Z1231 Encounter for screening mammogram for malignant neoplasm of breast: Secondary | ICD-10-CM

## 2016-12-12 ENCOUNTER — Other Ambulatory Visit: Payer: Self-pay | Admitting: Physical Medicine and Rehabilitation

## 2016-12-12 DIAGNOSIS — M5416 Radiculopathy, lumbar region: Secondary | ICD-10-CM

## 2016-12-21 ENCOUNTER — Ambulatory Visit
Admission: RE | Admit: 2016-12-21 | Discharge: 2016-12-21 | Disposition: A | Discharge status: Home / Self Care | Payer: Medicare Other | Source: Ambulatory Visit | Attending: Physical Medicine and Rehabilitation | Admitting: Physical Medicine and Rehabilitation

## 2016-12-21 DIAGNOSIS — M5416 Radiculopathy, lumbar region: Secondary | ICD-10-CM | POA: Insufficient documentation

## 2016-12-21 DIAGNOSIS — M47896 Other spondylosis, lumbar region: Secondary | ICD-10-CM | POA: Insufficient documentation

## 2016-12-21 DIAGNOSIS — M48061 Spinal stenosis, lumbar region without neurogenic claudication: Secondary | ICD-10-CM | POA: Insufficient documentation

## 2016-12-21 DIAGNOSIS — M5136 Other intervertebral disc degeneration, lumbar region: Secondary | ICD-10-CM | POA: Diagnosis present

## 2016-12-21 DIAGNOSIS — M5126 Other intervertebral disc displacement, lumbar region: Secondary | ICD-10-CM | POA: Diagnosis not present

## 2017-08-04 ENCOUNTER — Other Ambulatory Visit: Payer: Self-pay | Admitting: Physician Assistant

## 2017-08-04 DIAGNOSIS — Z1231 Encounter for screening mammogram for malignant neoplasm of breast: Secondary | ICD-10-CM

## 2017-08-23 ENCOUNTER — Ambulatory Visit
Admission: RE | Admit: 2017-08-23 | Discharge: 2017-08-23 | Disposition: A | Discharge status: Home / Self Care | Payer: Medicare HMO | Source: Ambulatory Visit | Attending: Physician Assistant | Admitting: Physician Assistant

## 2017-08-23 DIAGNOSIS — Z1231 Encounter for screening mammogram for malignant neoplasm of breast: Secondary | ICD-10-CM | POA: Insufficient documentation

## 2018-08-15 ENCOUNTER — Other Ambulatory Visit: Payer: Self-pay | Admitting: Physician Assistant

## 2018-08-15 DIAGNOSIS — Z1231 Encounter for screening mammogram for malignant neoplasm of breast: Secondary | ICD-10-CM

## 2018-08-31 ENCOUNTER — Ambulatory Visit
Admission: RE | Admit: 2018-08-31 | Discharge: 2018-08-31 | Disposition: A | Discharge status: Home / Self Care | Payer: Medicare HMO | Source: Ambulatory Visit | Attending: Physician Assistant | Admitting: Physician Assistant

## 2018-08-31 DIAGNOSIS — Z1231 Encounter for screening mammogram for malignant neoplasm of breast: Secondary | ICD-10-CM

## 2019-08-14 ENCOUNTER — Other Ambulatory Visit: Payer: Self-pay | Admitting: Physician Assistant

## 2019-08-14 DIAGNOSIS — Z1231 Encounter for screening mammogram for malignant neoplasm of breast: Secondary | ICD-10-CM

## 2019-09-11 ENCOUNTER — Ambulatory Visit
Admission: RE | Admit: 2019-09-11 | Discharge: 2019-09-11 | Disposition: A | Discharge status: Home / Self Care | Payer: Medicare HMO | Source: Ambulatory Visit | Attending: Physician Assistant | Admitting: Physician Assistant

## 2019-09-11 DIAGNOSIS — Z1231 Encounter for screening mammogram for malignant neoplasm of breast: Secondary | ICD-10-CM | POA: Diagnosis not present

## 2019-11-25 ENCOUNTER — Other Ambulatory Visit: Payer: Self-pay

## 2019-11-25 ENCOUNTER — Encounter: Payer: Self-pay | Admitting: Dermatology

## 2019-11-25 ENCOUNTER — Ambulatory Visit: Payer: Medicare HMO | Admitting: Dermatology

## 2019-11-25 DIAGNOSIS — L821 Other seborrheic keratosis: Secondary | ICD-10-CM

## 2019-11-25 DIAGNOSIS — B0230 Zoster ocular disease, unspecified: Secondary | ICD-10-CM | POA: Diagnosis not present

## 2019-11-25 DIAGNOSIS — B0229 Other postherpetic nervous system involvement: Secondary | ICD-10-CM

## 2019-11-25 DIAGNOSIS — D692 Other nonthrombocytopenic purpura: Secondary | ICD-10-CM

## 2019-11-25 DIAGNOSIS — L578 Other skin changes due to chronic exposure to nonionizing radiation: Secondary | ICD-10-CM | POA: Diagnosis not present

## 2019-11-25 DIAGNOSIS — L820 Inflamed seborrheic keratosis: Secondary | ICD-10-CM | POA: Diagnosis not present

## 2019-11-25 NOTE — Progress Notes (Signed)
   Follow-Up Visit   Subjective  Stacy Daniels is a 79 y.o. female who presents for the following: recheck shingles with post herpetic neuralgia (patient currently using Gabapentin 600mg  po QD, Voltaren gel, Mometasone cream, and Erythromycin sol. she is still experiencing itching and pain at aa's. She did try Zostrix but it caused burning and stinging to aa so significant that she stopped using it) and lesion (on the L foot - has been there for years and is scaly and rough at times).  The following portions of the chart were reviewed this encounter and updated as appropriate:  Allergies  Meds  Problems  Med Hx  Surg Hx  Fam Hx     Review of Systems:  No other skin or systemic complaints except as noted in HPI or Assessment and Plan.  Objective  Well appearing patient in no apparent distress; mood and affect are within normal limits.  A focused examination was performed including the face and left foot. Relevant physical exam findings are noted in the Assessment and Plan.  Objective  face and eyes: Face and eyes clear today   Objective  L dorsum lat foot: 1.5 x 1.0 cm Erythematous keratotic or waxy stuck-on papule or plaque.   Assessment & Plan  Herpes zoster with ophthalmic complication, unspecified herpes zoster eye disease face and eyes  With post herpetic neuralgia - patient still experiencing itching and pain of the eye and face Discussed Zostrix but patient had significant pain during use, continue Mometasone 0.1% cream to aa's QD up to 5d/wk. Samples given of Eucrisa 2% oint apply to aa's BID PRN. Consider Protopic or Elidel if Eucrisa doesn't help or isn't covered by her insurance. Continue Voltaren gel daily PRN. Continue Gabapentin 600mg  as prescribed. Continue Erythromycin oint as prescribed.  Inflamed seborrheic keratosis L dorsum lat foot  Vs AK - recheck at follow up, if not improved may biopsy   Destruction of lesion - L dorsum lat foot Complexity: simple     Destruction method: cryotherapy   Informed consent: discussed and consent obtained   Timeout:  patient name, date of birth, surgical site, and procedure verified Lesion destroyed using liquid nitrogen: Yes   Region frozen until ice ball extended beyond lesion: Yes   Outcome: patient tolerated procedure well with no complications   Post-procedure details: wound care instructions given     Purpura - Violaceous macules and patches - Benign - Related to age, sun damage and/or use of blood thinners - Observe - Can use OTC arnica containing moisturizer such as Dermend Bruise Formula if desired - Call for worsening or other concerns  Actinic Damage - diffuse scaly erythematous macules with underlying dyspigmentation - Recommend daily broad spectrum sunscreen SPF 30+ to sun-exposed areas, reapply every 2 hours as needed.  - Call for new or changing lesions.  Seborrheic Keratoses - Stuck-on, waxy, tan-brown papules and plaques  - Discussed benign etiology and prognosis. - Observe - Call for any changes  Return in about 6 weeks (around 01/06/2020).  , CMA, am acting as scribe for 03/07/2020, MD .  Documentation: I have reviewed the above documentation for accuracy and completeness, and I agree with the above.  Maylene Roes, MD

## 2020-02-20 ENCOUNTER — Encounter: Payer: Self-pay | Admitting: Dermatology

## 2020-02-20 ENCOUNTER — Other Ambulatory Visit: Payer: Self-pay

## 2020-02-20 ENCOUNTER — Ambulatory Visit: Payer: Medicare HMO | Admitting: Dermatology

## 2020-02-20 DIAGNOSIS — L820 Inflamed seborrheic keratosis: Secondary | ICD-10-CM

## 2020-02-20 DIAGNOSIS — D692 Other nonthrombocytopenic purpura: Secondary | ICD-10-CM

## 2020-02-20 DIAGNOSIS — Z872 Personal history of diseases of the skin and subcutaneous tissue: Secondary | ICD-10-CM | POA: Diagnosis not present

## 2020-02-20 DIAGNOSIS — B0230 Zoster ocular disease, unspecified: Secondary | ICD-10-CM | POA: Diagnosis not present

## 2020-02-20 DIAGNOSIS — H61001 Unspecified perichondritis of right external ear: Secondary | ICD-10-CM

## 2020-02-20 MED ORDER — TACROLIMUS 0.1 % EX OINT: TOPICAL_OINTMENT | Freq: Two times a day (BID) | CUTANEOUS | 0 refills | Status: DC

## 2020-02-20 NOTE — Patient Instructions (Signed)
Recommend daily broad spectrum sunscreen SPF 30+ to sun-exposed areas, reapply every 2 hours as needed. Call for new or changing lesions.  

## 2020-02-20 NOTE — Progress Notes (Signed)
   Follow-Up Visit   Subjective  Stacy Daniels is a 79 y.o. female who presents for the following: Follow-up (history of shingles on left face and left foot) with persistent and symptomatic postherpetic neuralgia.  Patient presents today for follow up visit from 11/25/19 for Shingles, using Voltaren, Gabapentin, mometasone and Eucrisa  and  An ISK vs AK L. Dorsum foot that received cryotherapy. Patient feels as if her whole body is itching. Patient has tried [place] but made the area red so patient stopped using She has other areas she would like checked.  She had an inflamed seborrheic keratosis of the foot treated and she feels like is clear.  She has spots on her arms she like checked.  The following portions of the chart were reviewed this encounter and updated as appropriate:  Allergies  Meds  Problems  Med Hx  Surg Hx  Fam Hx     Review of Systems:  No other skin or systemic complaints except as noted in HPI or Assessment and Plan.  Objective  Well appearing patient in no apparent distress; mood and affect are within normal limits.  A focused examination was performed including Left side face, Left foot dorsum. Relevant physical exam findings are noted in the Assessment and Plan.  Objective  B/L forearm: Violaceous macules and patches.   Objective  Right Antihelix: Erythematous tender patch  Objective  Left Face: Persistent erythema of left face  Objective  Left Dorsum of Foot: Resolved   Assessment & Plan    Senile purpura (HCC) B/L forearm  Due to the sensitivity of skin. 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  And then she is to discontinue mometasone.  Discontinue use of Mometasone 0.1% cream when you start using Protopic. Start Protopic apply to aa's BID PRN. Consider Elidel if Protopic doesn't help or isn't covered by her insurance.  Continue Voltaren gel daily PRN  Continue Gabapentin 600mg  as prescribed.  Discussed that this can be chronic and may not resolve, or can take a very long time to resolve.  Time may improve her condition.  Discussed laser treatment for the redness if desired, this would be totally cosmetic and not covered by insurance.  tacrolimus (PROTOPIC) 0.1 % ointment - Left Face  History of inflamed seborrheic keratosis treated in the past Left Dorsum of Foot Clear. Observe for recurrence. Call clinic for new or changing lesions.  Recommend regular skin exams, daily broad-spectrum spf 30+ sunscreen use, and photoprotection.     Return in about 3 months (around 05/22/2020) for Shingles.  05/24/2020, CMA, am acting as scribe for Allen Norris, MD . Documentation: I have reviewed the above documentation for accuracy and completeness, and I agree with the above.  Armida Sans, MD

## 2020-02-23 ENCOUNTER — Encounter: Payer: Self-pay | Admitting: Dermatology

## 2020-02-24 ENCOUNTER — Telehealth: Payer: Self-pay

## 2020-02-24 NOTE — Telephone Encounter (Signed)
When she was in previously, she had purpura (bruising) on her left face which may be from rubbing and scratching due to the itch or pain she has from post-herpetic neuralgia.  I think this is the source of bleeding.  Her protopic should not be the cause, but if she wants to stop it to see how she does, she may.

## 2020-02-24 NOTE — Telephone Encounter (Signed)
Patient called regarding her Tacrolimus Ointment. Since starting medication she has noticed her face bleeding. She states yesterday was a stream of blood but the days prior was just a little. She has not used medication today. She states she is applying BID and only to the left side of face which is also where the blood is coming from.

## 2020-02-24 NOTE — Telephone Encounter (Signed)
Patient advised of information per Dr. Kowalski.  

## 2020-03-02 ENCOUNTER — Other Ambulatory Visit: Payer: Self-pay | Admitting: Dermatology

## 2020-05-19 ENCOUNTER — Other Ambulatory Visit: Payer: Self-pay | Admitting: Dermatology

## 2020-05-19 DIAGNOSIS — B0230 Zoster ocular disease, unspecified: Secondary | ICD-10-CM

## 2020-06-10 ENCOUNTER — Ambulatory Visit: Payer: Medicare HMO | Admitting: Dermatology

## 2020-06-10 ENCOUNTER — Other Ambulatory Visit: Payer: Self-pay

## 2020-06-10 DIAGNOSIS — H61001 Unspecified perichondritis of right external ear: Secondary | ICD-10-CM

## 2020-06-10 DIAGNOSIS — B009 Herpesviral infection, unspecified: Secondary | ICD-10-CM

## 2020-06-10 DIAGNOSIS — B0229 Other postherpetic nervous system involvement: Secondary | ICD-10-CM

## 2020-06-10 MED ORDER — FAMCICLOVIR 500 MG PO TABS: 500.0000 mg | ORAL_TABLET | Freq: Two times a day (BID) | ORAL | 4 refills | Status: DC

## 2020-06-10 NOTE — Patient Instructions (Addendum)
  Cont Voltaren twice a day for pain Start Sarna lotion as needed for itch  Continue mometasone cream only when you have a rash on your face, and use once daily up to 5 days a week

## 2020-06-10 NOTE — Progress Notes (Signed)
   Follow-Up Visit   Subjective  Stacy Daniels is a 79 y.o. female who presents for the following: Post herpetic neuralgia (31m f/u, face, ears, mometsone cr qd 5d/wk, gabapentin , voltaren gel bid helps with pain, pt tried protopic and d/c, ), check sore spot (R ear, >4m), and check spot (lip, ~2wks).  The following portions of the chart were reviewed this encounter and updated as appropriate:  Allergies  Meds  Problems  Med Hx  Surg Hx  Fam Hx     Review of Systems:  No other skin or systemic complaints except as noted in HPI or Assessment and Plan.  Objective  Well appearing patient in no apparent distress; mood and affect are within normal limits.  A focused examination was performed including face, ears, hands. Relevant physical exam findings are noted in the Assessment and Plan.  Objective  face, ears: Face clear  Objective  Right mid antihelix: Erythematous pap  Images    Objective  upper lip: Erosion upper lip   Assessment & Plan    Purpura - chronic - Violaceous macules and patches - Benign - Related to age, sun damage and/or use of blood thinners - Observe - Can use OTC arnica containing moisturizer such as Dermend Bruise Formula if desired - Call for worsening or other concerns  Post herpetic neuralgia face, ears Patient still experiencing itching and pain of the eye and face Chronic, not currently at goal. 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  May improve with treatment, but may relapse.  It is associated with sun damage and pressure.  Intralesional injection - Right mid antihelix Location: R ear antihelix  Informed Consent: Discussed risks (infection, pain, bleeding, bruising, thinning of the skin, loss of skin pigment, lack of resolution,  and recurrence of lesion) and benefits of the procedure, as well as the alternatives. Informed consent was obtained. Preparation: The area was prepared a standard fashion.  Anesthesia:none  Procedure Details: An intralesional injection was performed with Kenalog 2 mg/cc. 0.1 cc in total were injected.  Total number of injections: 1  Plan: The patient was instructed on post-op care. Recommend OTC analgesia as needed for pain.  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  Not curable, but treatable. famciclovir (FAMVIR) 500 MG tablet - upper lip  Return in about 3 months (around 09/10/2020) for post herpetic neuralgia.   I, 11/08/2020, RMA, am acting as scribe for Ardis Rowan, MD .  Documentation: I have reviewed the above documentation for accuracy and completeness, and I agree with the above.  Armida Sans, MD

## 2020-06-11 ENCOUNTER — Encounter: Payer: Self-pay | Admitting: Dermatology

## 2020-06-29 ENCOUNTER — Other Ambulatory Visit: Payer: Self-pay | Admitting: Dermatology

## 2020-07-28 DIAGNOSIS — I10 Essential (primary) hypertension: Secondary | ICD-10-CM | POA: Insufficient documentation

## 2020-08-07 ENCOUNTER — Other Ambulatory Visit: Payer: Self-pay | Admitting: Physician Assistant

## 2020-08-07 DIAGNOSIS — Z1231 Encounter for screening mammogram for malignant neoplasm of breast: Secondary | ICD-10-CM

## 2020-09-15 ENCOUNTER — Ambulatory Visit
Admission: RE | Admit: 2020-09-15 | Discharge: 2020-09-15 | Disposition: A | Discharge status: Home / Self Care | Payer: Medicare HMO | Source: Ambulatory Visit | Attending: Physician Assistant | Admitting: Physician Assistant

## 2020-09-15 ENCOUNTER — Other Ambulatory Visit: Payer: Self-pay

## 2020-09-15 DIAGNOSIS — Z1231 Encounter for screening mammogram for malignant neoplasm of breast: Secondary | ICD-10-CM | POA: Diagnosis present

## 2020-10-15 ENCOUNTER — Ambulatory Visit: Payer: Medicare HMO | Admitting: Dermatology

## 2020-11-29 ENCOUNTER — Other Ambulatory Visit: Payer: Self-pay | Admitting: Dermatology

## 2020-12-27 DIAGNOSIS — B0229 Other postherpetic nervous system involvement: Secondary | ICD-10-CM | POA: Insufficient documentation

## 2021-01-26 ENCOUNTER — Other Ambulatory Visit: Payer: Self-pay

## 2021-01-26 ENCOUNTER — Ambulatory Visit: Payer: Medicare HMO | Admitting: Dermatology

## 2021-01-26 DIAGNOSIS — B0229 Other postherpetic nervous system involvement: Secondary | ICD-10-CM | POA: Diagnosis not present

## 2021-01-26 DIAGNOSIS — D692 Other nonthrombocytopenic purpura: Secondary | ICD-10-CM | POA: Diagnosis not present

## 2021-01-26 MED ORDER — OPZELURA 1.5 % EX CREA: 1.0000 "application " | TOPICAL_CREAM | Freq: Two times a day (BID) | CUTANEOUS | 2 refills | Status: DC

## 2021-01-26 NOTE — Patient Instructions (Signed)

## 2021-01-26 NOTE — Progress Notes (Signed)
   Follow-Up Visit   Subjective  Stacy Daniels is a 80 y.o. female who presents for the following: Post herpetic neuralgia (Face, ears, mometasone cr bid , votaren gel bid, voltaren helps with pain sarna didn't help).  The following portions of the chart were reviewed this encounter and updated as appropriate:   Allergies  Meds  Problems  Med Hx  Surg Hx  Fam Hx      Review of Systems:  No other skin or systemic complaints except as noted in HPI or Assessment and Plan.  Objective  Well appearing patient in no apparent distress; mood and affect are within normal limits.  A focused examination was performed including face, ears. Relevant physical exam findings are noted in the Assessment and Plan.  L face, L ear L temple with mild atrophy   Assessment & Plan   Purpura - Chronic; persistent and recurrent.  Treatable, but not curable. - Violaceous macules and patches - Benign - Related to trauma, age, sun damage and/or use of blood thinners, chronic use of topical and/or oral steroids - Observe - Can use OTC arnica containing moisturizer such as Dermend Bruise Formula if desired - Call for worsening or other concerns - Not using any steroid cream on hands, wrist and neck where patient has purpura  Post herpetic neuralgia - severe & causing significant quality of life issues L face, L ear Chronic and persistent with significant symptoms of itch and pain - some improvement with current treatment.  With some mild skin atrophy on L temple Patient still experiencing itching and pain of the eye and face Chronic, not currently at goal, persistent, treatable but not curable  D/C Mometasone cr (advised this may be a factor in skin atrophy) Start Opzelura cream bid, sample x 1, Lot TBAC-A, exp 09/2022 Cont Voltaren gel bid  May consider Skin Medicinals anti itch mix if Opzelura doesn't help  Ruxolitinib Phosphate (OPZELURA) 1.5 % CREA - L face, L ear Apply 1 application  topically 2 (two) times daily. Bid to aa itching face, hands  Return in about 3 months (around 04/28/2021) for post herpetic neuralgia.  I, Ardis Rowan, RMA, am acting as scribe for Armida Sans, MD . Documentation: I have reviewed the above documentation for accuracy and completeness, and I agree with the above.  Armida Sans, MD

## 2021-02-03 ENCOUNTER — Telehealth: Payer: Self-pay

## 2021-02-03 NOTE — Telephone Encounter (Signed)
Patient called regarding the Opzulera sample given by Dr. Gwen Pounds to try. Patient states it made her atopic derm worse. Patient states it increased itching and redness.

## 2021-02-03 NOTE — Telephone Encounter (Signed)
RX sent in per Dr. Neale Burly and patient advised of medication and pharmacy change.

## 2021-02-03 NOTE — Telephone Encounter (Signed)
Thank you :)

## 2021-02-06 ENCOUNTER — Encounter: Payer: Self-pay | Admitting: Dermatology

## 2021-02-09 ENCOUNTER — Ambulatory Visit: Payer: Medicare HMO | Admitting: Dermatology

## 2021-04-28 ENCOUNTER — Other Ambulatory Visit: Payer: Self-pay

## 2021-04-28 ENCOUNTER — Ambulatory Visit: Payer: Medicare HMO | Admitting: Dermatology

## 2021-04-28 ENCOUNTER — Encounter: Payer: Self-pay | Admitting: Dermatology

## 2021-04-28 DIAGNOSIS — H61022 Chronic perichondritis of left external ear: Secondary | ICD-10-CM

## 2021-04-28 DIAGNOSIS — B0229 Other postherpetic nervous system involvement: Secondary | ICD-10-CM | POA: Diagnosis not present

## 2021-04-28 DIAGNOSIS — H61002 Unspecified perichondritis of left external ear: Secondary | ICD-10-CM

## 2021-04-28 MED ORDER — PIMECROLIMUS 1 % EX CREA: TOPICAL_CREAM | Freq: Two times a day (BID) | CUTANEOUS | 0 refills | Status: DC

## 2021-04-28 NOTE — Progress Notes (Signed)
   Follow-Up Visit   Subjective  Stacy Daniels is a 80 y.o. female who presents for the following: Rash (3 months f/u neuralgia on the left face and left ear, treating with skin medicinal anti itch cream with a fair response ). Past treatment Mometasone cream, Tacrolimus ointment, Opzelura cream no help. Pt was seen by a neurologist in the past they prescribed Gabapentin, which pt can not tolerate Gabapentin.  Overall she seems to be doing better, but still scratching a lot especially in morning.  The following portions of the chart were reviewed this encounter and updated as appropriate:   Allergies  Meds  Problems  Med Hx  Surg Hx  Fam Hx      Review of Systems:  No other skin or systemic complaints except as noted in HPI or Assessment and Plan.  Objective  Well appearing patient in no apparent distress; mood and affect are within normal limits.  A focused examination was performed including face,ears. Relevant physical exam findings are noted in the Assessment and Plan.  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 Chronic and persistent with significant symptoms of itch and pain - some improvement with current treatment.   With some mild skin atrophy on L temple we will avoid topical steroid cream.  Patient still experiencing itching and pain of the eye and face Chronic, not currently at goal, persistent, treatable but not curable   Start Elidel (pimecrolimus) cream Cont Voltaren gel bid prn Cont skin medicinals anti itch cream  D/C Mometasone cr (advised this may be a factor in skin atrophy) D/C Opzelura cream   We will refer to Pain clinic for evaluation   Related Procedures Ambulatory referral to Pain Clinic  Related  Medications pimecrolimus (ELIDEL) 1 % cream Apply topically 2 (two) times daily.  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  IAngelique Holm, CMA, am acting as scribe for Armida Sans, MD .  Documentation: I have reviewed the above documentation for accuracy and completeness, and I agree with the above.  Armida Sans, MD

## 2021-04-28 NOTE — Patient Instructions (Signed)

## 2021-07-08 ENCOUNTER — Telehealth: Payer: Self-pay

## 2021-07-08 NOTE — Telephone Encounter (Signed)
Spoke with patient while following up on referrals. Patient states she never heard from the Pain Clinic but I can see in their notes it was received. Patient was never called. Asked patient if she would like their phone number to call and make her appointment. Patient states she has had improvement with the cream prescribed by Dr. Gwen Pounds and would decline referral at this time.

## 2021-08-05 ENCOUNTER — Telehealth: Payer: Self-pay

## 2021-08-05 NOTE — Telephone Encounter (Signed)
Patient has called wanting the referral back in place for the Pain Clinic. Referral opened back up and patient provided with their office number to call and schedule appointment. aw

## 2021-08-25 ENCOUNTER — Other Ambulatory Visit: Payer: Self-pay | Admitting: Physician Assistant

## 2021-08-25 DIAGNOSIS — Z1231 Encounter for screening mammogram for malignant neoplasm of breast: Secondary | ICD-10-CM

## 2021-10-01 ENCOUNTER — Other Ambulatory Visit: Payer: Self-pay

## 2021-10-01 ENCOUNTER — Ambulatory Visit
Admission: RE | Admit: 2021-10-01 | Discharge: 2021-10-01 | Disposition: A | Discharge status: Home / Self Care | Payer: Medicare HMO | Source: Ambulatory Visit | Attending: Physician Assistant | Admitting: Physician Assistant

## 2021-10-01 DIAGNOSIS — Z1231 Encounter for screening mammogram for malignant neoplasm of breast: Secondary | ICD-10-CM | POA: Diagnosis present

## 2021-10-09 LAB — EXTERNAL GENERIC LAB PROCEDURE: COLOGUARD: NEGATIVE

## 2021-10-09 LAB — COLOGUARD: COLOGUARD: NEGATIVE

## 2021-10-27 ENCOUNTER — Other Ambulatory Visit: Payer: Self-pay

## 2021-10-27 ENCOUNTER — Ambulatory Visit: Payer: Medicare HMO | Admitting: Dermatology

## 2021-10-27 DIAGNOSIS — D692 Other nonthrombocytopenic purpura: Secondary | ICD-10-CM | POA: Diagnosis not present

## 2021-10-27 DIAGNOSIS — B0229 Other postherpetic nervous system involvement: Secondary | ICD-10-CM

## 2021-10-27 NOTE — Progress Notes (Signed)
? ?  Follow-Up Visit ?  ?Subjective  ?Stacy Daniels is a 81 y.o. female who presents for the following: Follow-up (6 months f/u on post herpetic neuralgia on her face treating skin medicinals /And Voltaren cream with a good response ). ?She continues to have bruising of her arms and hands.  She is wonders if there is any other treatment options available.  She does use a topical Arnica cream. ? ?The following portions of the chart were reviewed this encounter and updated as appropriate:  ? Tobacco  Allergies  Meds  Problems  Med Hx  Surg Hx  Fam Hx   ?  ?Review of Systems:  No other skin or systemic complaints except as noted in HPI or Assessment and Plan. ? ?Objective  ?Well appearing patient in no apparent distress; mood and affect are within normal limits. ? ?A focused examination was performed including face. Relevant physical exam findings are noted in the Assessment and Plan. ? ?Head - Anterior (Face) ?Left temple with mild atrophy  ? ? 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 ?Chronic and persistent condition with duration or expected duration over one year. Condition is symptomatic / bothersome to patient. Not to goal. ?Improved on current treatment. ?  ?With some mild skin atrophy on L temple we will avoid topical steroid cream.  ?  ?Cont Voltaren gel bid prn ?Cont skin medicinals anti itch cream  ? ?Cont skin medicinals Amitriptyline: 5% ?Gabapentin: 10% ?Lidocaine: 5% ?Vehicle: Cream ? ?Cont Voltaren cream  ? ?Purpura - Chronic; persistent and recurrent.  Treatable, but not curable. ?Arms, hands  ?- Violaceous macules and patches ?- Benign ?- Related to trauma, age, sun damage and/or use of blood thinners, chronic use of topical and/or oral steroids ?- Observe ?- Can use OTC arnica containing moisturizer such as Dermend Bruise Formula if desired ?- Call for worsening  or other concerns  ? ?Return in about 1 year (around 10/28/2022) for Post herpetic neuralgia . ? ?I, Angelique Holm, CMA, am acting as scribe for Armida Sans, MD .  ?Documentation: I have reviewed the above documentation for accuracy and completeness, and I agree with the above. ? ?Armida Sans, MD ? ?

## 2021-10-27 NOTE — Patient Instructions (Addendum)

## 2021-10-28 ENCOUNTER — Encounter: Payer: Self-pay | Admitting: Dermatology

## 2022-09-30 ENCOUNTER — Other Ambulatory Visit: Payer: Self-pay | Admitting: Physician Assistant

## 2022-09-30 DIAGNOSIS — Z1231 Encounter for screening mammogram for malignant neoplasm of breast: Secondary | ICD-10-CM

## 2022-10-19 ENCOUNTER — Ambulatory Visit
Admission: RE | Admit: 2022-10-19 | Discharge: 2022-10-19 | Disposition: A | Discharge status: Home / Self Care | Payer: Medicare HMO | Source: Ambulatory Visit | Attending: Physician Assistant | Admitting: Physician Assistant

## 2022-10-19 DIAGNOSIS — Z1231 Encounter for screening mammogram for malignant neoplasm of breast: Secondary | ICD-10-CM | POA: Diagnosis present

## 2022-10-26 ENCOUNTER — Ambulatory Visit: Payer: Medicare HMO | Admitting: Dermatology

## 2022-10-26 ENCOUNTER — Encounter: Payer: Self-pay | Admitting: Dermatology

## 2022-10-26 VITALS — BP 138/89 | HR 79

## 2022-10-26 DIAGNOSIS — L570 Actinic keratosis: Secondary | ICD-10-CM | POA: Diagnosis not present

## 2022-10-26 DIAGNOSIS — D692 Other nonthrombocytopenic purpura: Secondary | ICD-10-CM

## 2022-10-26 DIAGNOSIS — B0229 Other postherpetic nervous system involvement: Secondary | ICD-10-CM

## 2022-10-26 DIAGNOSIS — L578 Other skin changes due to chronic exposure to nonionizing radiation: Secondary | ICD-10-CM | POA: Diagnosis not present

## 2022-10-26 DIAGNOSIS — Z79899 Other long term (current) drug therapy: Secondary | ICD-10-CM

## 2022-10-26 NOTE — Progress Notes (Signed)
Follow-Up Visit   Subjective  Stacy Daniels is a 82 y.o. female who presents for the following: 6 months f/u on post herpetic neuralgia on her face treating skin medicinals  Amitriptyline: 5% Gabapentin:10%. Lidocaine: 5% cream with a good response. The patient has spots, moles and lesions to be evaluated, some may be new or changing and the patient has concerns that these could be cancer.  The following portions of the chart were reviewed this encounter and updated as appropriate: medications, allergies, medical history  Review of Systems:  No other skin or systemic complaints except as noted in HPI or Assessment and Plan.  Objective  Well appearing patient in no apparent distress; mood and affect are within normal limits. A focused examination was performed of the following areas: Relevant exam findings are noted in the Assessment and Plan.   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 Chronic and persistent condition with duration or expected duration over one year. Condition is symptomatic / bothersome to patient. Not to goal. Improved on current treatment.   With some mild skin atrophy on L temple we will avoid topical steroid cream.    Cont skin medicinals Amitriptyline: 5% Gabapentin: 10% Lidocaine: 5% Vehicle: Cream  Purpura - Chronic; persistent and recurrent.  Treatable, but not curable. Location: hands, arms  - Violaceous macules and patches - Benign - Related to trauma, age, sun damage and/or use of blood thinners, chronic use of topical and/or oral steroids - Observe - Can use OTC arnica containing moisturizer such as Dermend Bruise Formula if desired - Call for worsening or other concerns   ACTINIC KERATOSIS Exam: Erythematous thin papules/macules with gritty scale  Actinic keratoses are precancerous spots that appear secondary to cumulative UV  radiation exposure/sun exposure over time. They are chronic with expected duration over 1 year. A portion of actinic keratoses will progress to squamous cell carcinoma of the skin. It is not possible to reliably predict which spots will progress to skin cancer and so treatment is recommended to prevent development of skin cancer.  Recommend daily broad spectrum sunscreen SPF 30+ to sun-exposed areas, reapply every 2 hours as needed.  Recommend staying in the shade or wearing long sleeves, sun glasses (UVA+UVB protection) and wide brim hats (4-inch brim around the entire circumference of the hat). Call for new or changing lesions.  Treatment Plan:  Prior to procedure, discussed risks of blister formation, small wound, skin dyspigmentation, or rare scar following cryotherapy. Recommend Vaseline ointment to treated areas while healing.  Destruction Procedure Note Destruction method: cryotherapy   Informed consent: discussed and consent obtained   Lesion destroyed using liquid nitrogen: Yes   Outcome: patient tolerated procedure well with no complications   Post-procedure details: wound care instructions given   Locations: left zygoma  # of Lesions Treated: 1  If not gone away completely by 6 weeks return the the office  Plan biopsy if persistent in the future.  ACTINIC DAMAGE - chronic, secondary to cumulative UV radiation exposure/sun exposure over time - diffuse scaly erythematous macules with underlying dyspigmentation - Recommend daily broad spectrum sunscreen SPF 30+ to sun-exposed areas, reapply every 2 hours as needed.  - Recommend staying in the shade or wearing long sleeves, sun glasses (UVA+UVB protection) and wide brim hats (4-inch brim around the entire circumference of the hat). - Call for new or changing lesions.  Return in about 4  months (around 02/25/2023) for Aks, post herpetic neuralgia .  IMarye Round, CMA, am acting as scribe for Sarina Ser, MD .    Documentation: I have reviewed the above documentation for accuracy and completeness, and I agree with the above.  Sarina Ser, MD

## 2022-10-26 NOTE — Patient Instructions (Addendum)
Cryotherapy Aftercare  Wash gently with soap and water everyday.   Apply Vaseline and Band-Aid daily until healed.     Due to recent changes in healthcare laws, you may see results of your pathology and/or laboratory studies on MyChart before the doctors have had a chance to review them. We understand that in some cases there may be results that are confusing or concerning to you. Please understand that not all results are received at the same time and often the doctors may need to interpret multiple results in order to provide you with the best plan of care or course of treatment. Therefore, we ask that you please give us 2 business days to thoroughly review all your results before contacting the office for clarification. Should we see a critical lab result, you will be contacted sooner.   If You Need Anything After Your Visit  If you have any questions or concerns for your doctor, please call our main line at [phone] and press option 4 to reach your doctor's medical assistant. If no one answers, please leave a voicemail as directed and we will return your call as soon as possible. Messages left after 4 pm will be answered the following business day.   You may also send us a message via MyChart. We typically respond to MyChart messages within 1-2 business days.  For prescription refills, please ask your pharmacy to contact our office. Our fax number is [phone].  If you have an urgent issue when the clinic is closed that cannot wait until the next business day, you can page your doctor at the number below.    Please note that while we do our best to be available for urgent issues outside of office hours, we are not available 24/7.   If you have an urgent issue and are unable to reach us, you may choose to seek medical care at your doctor's office, retail clinic, urgent care center, or emergency room.  If you have a medical emergency, please immediately call 911 or go to the  emergency department.  Pager Numbers  - Dr. Kowalski: [phone]  - Dr. Moye: [phone]  - Dr. Stewart: [phone]  In the event of inclement weather, please call our main line at [phone] for an update on the status of any delays or closures.  Dermatology Medication Tips: Please keep the boxes that topical medications come in in order to help keep track of the instructions about where and how to use these. Pharmacies typically print the medication instructions only on the boxes and not directly on the medication tubes.   If your medication is too expensive, please contact our office at [phone] option 4 or send us a message through MyChart.   We are unable to tell what your co-pay for medications will be in advance as this is different depending on your insurance coverage. However, we may be able to find a substitute medication at lower cost or fill out paperwork to get insurance to cover a needed medication.   If a prior authorization is required to get your medication covered by your insurance company, please allow us 1-2 business days to complete this process.  Drug prices often vary depending on where the prescription is filled and some pharmacies may offer cheaper prices.  The website www.goodrx.com contains coupons for medications through different pharmacies. The prices here do not account for what the cost may be with help from insurance (it may be cheaper with your insurance), but the website can   give you the price if you did not use any insurance.  - You can print the associated coupon and take it with your prescription to the pharmacy.  - You may also stop by our office during regular business hours and pick up a GoodRx coupon card.  - If you need your prescription sent electronically to a different pharmacy, notify our office through [facility] MyChart or by phone at [phone] option 4.     Si Usted Necesita Algo Despus de Su Visita  Tambin puede  enviarnos un mensaje a travs de MyChart. Por lo general respondemos a los mensajes de MyChart en el transcurso de 1 a 2 das hbiles.  Para renovar recetas, por favor pida a su farmacia que se ponga en contacto con nuestra oficina. Nuestro nmero de fax es el [phone].  Si tiene un asunto urgente cuando la clnica est cerrada y que no puede esperar hasta el siguiente da hbil, puede llamar/localizar a su doctor(a) al nmero que aparece a continuacin.   Por favor, tenga en cuenta que aunque hacemos todo lo posible para estar disponibles para asuntos urgentes fuera del horario de oficina, no estamos disponibles las 24 horas del da, los 7 das de la semana.   Si tiene un problema urgente y no puede comunicarse con nosotros, puede optar por buscar atencin mdica  en el consultorio de su doctor(a), en una clnica privada, en un centro de atencin urgente o en una sala de emergencias.  Si tiene una emergencia mdica, por favor llame inmediatamente al 911 o vaya a la sala de emergencias.  Nmeros de bper  - Dr. Kowalski: [phone]  - Dra. Moye: [phone]  - Dra. Stewart: [phone]  En caso de inclemencias del tiempo, por favor llame a nuestra lnea principal al [phone] para una actualizacin sobre el estado de cualquier retraso o cierre.  Consejos para la medicacin en dermatologa: Por favor, guarde las cajas en las que vienen los medicamentos de uso tpico para ayudarle a seguir las instrucciones sobre dnde y cmo usarlos. Las farmacias generalmente imprimen las instrucciones del medicamento slo en las cajas y no directamente en los tubos del medicamento.   Si su medicamento es muy caro, por favor, pngase en contacto con nuestra oficina llamando al [phone] y presione la opcin 4 o envenos un mensaje a travs de MyChart.   No podemos decirle cul ser su copago por los medicamentos por adelantado ya que esto es diferente dependiendo de la cobertura de su seguro.  Sin embargo, es posible que podamos encontrar un medicamento sustituto a menor costo o llenar un formulario para que el seguro cubra el medicamento que se considera necesario.   Si se requiere una autorizacin previa para que su compaa de seguros cubra su medicamento, por favor permtanos de 1 a 2 das hbiles para completar este proceso.  Los precios de los medicamentos varan con frecuencia dependiendo del lugar de dnde se surte la receta y alguna farmacias pueden ofrecer precios ms baratos.  El sitio web www.goodrx.com tiene cupones para medicamentos de diferentes farmacias. Los precios aqu no tienen en cuenta lo que podra costar con la ayuda del seguro (puede ser ms barato con su seguro), pero el sitio web puede darle el precio si no utiliz ningn seguro.  - Puede imprimir el cupn correspondiente y llevarlo con su receta a la farmacia.  - Tambin puede pasar por nuestra oficina durante el horario de atencin regular y recoger una tarjeta de cupones de GoodRx.  -   Si necesita que su receta se enve electrnicamente a una farmacia diferente, informe a nuestra oficina a travs de MyChart de [place] o por telfono llamando al [phone] y presione la opcin 4.  

## 2023-02-23 ENCOUNTER — Ambulatory Visit: Payer: Medicare HMO | Admitting: Dermatology

## 2023-02-23 VITALS — BP 138/78 | HR 70

## 2023-02-23 DIAGNOSIS — L578 Other skin changes due to chronic exposure to nonionizing radiation: Secondary | ICD-10-CM | POA: Diagnosis not present

## 2023-02-23 DIAGNOSIS — W908XXA Exposure to other nonionizing radiation, initial encounter: Secondary | ICD-10-CM | POA: Diagnosis not present

## 2023-02-23 DIAGNOSIS — Z872 Personal history of diseases of the skin and subcutaneous tissue: Secondary | ICD-10-CM

## 2023-02-23 DIAGNOSIS — Z79899 Other long term (current) drug therapy: Secondary | ICD-10-CM

## 2023-02-23 DIAGNOSIS — B0229 Other postherpetic nervous system involvement: Secondary | ICD-10-CM | POA: Diagnosis not present

## 2023-02-23 DIAGNOSIS — Z7189 Other specified counseling: Secondary | ICD-10-CM

## 2023-02-23 NOTE — Progress Notes (Signed)
   Follow-Up Visit   Subjective  Stacy Daniels is a 82 y.o. female who presents for the following: 4 months f/u on precancer on the left cheek treated with LN2  The patient has spots, moles and lesions to be evaluated, some may be new or changing and the patient may have concern these could be cancer.  The following portions of the chart were reviewed this encounter and updated as appropriate: medications, allergies, medical history  Review of Systems:  No other skin or systemic complaints except as noted in HPI or Assessment and Plan.  Objective  Well appearing patient in no apparent distress; mood and affect are within normal limits. A focused examination was performed of the following areas: Relevant exam findings are noted in the Assessment and Plan.  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  Her current treatment has helped, but still with symptoms. Chronic and persistent condition with duration or expected duration over one year. Condition is symptomatic / bothersome to patient. Not to goal. Improved on current treatment. Patient was referred to pain clinic last year but was never contacted for an appointment, patient declines a new referral to the pain clinic    With some mild skin atrophy on L temple we will avoid topical steroid cream.    Cont skin medicinals anti itch cream    Cont skin medicinals Amitriptyline: 5% Gabapentin: 10% Lidocaine: 5%  History of actinic keratosis left zygoma  Actinic keratoses are precancerous spots that appear secondary to cumulative UV radiation exposure/sun exposure over time. They are chronic with expected duration over 1 year. A portion of actinic keratoses will progress to squamous cell carcinoma of the skin. It is not possible to reliably predict which spots will progress to skin cancer and so  treatment is recommended to prevent development of skin cancer.  Recommend daily broad spectrum sunscreen SPF 30+ to sun-exposed areas, reapply every 2 hours as needed.  Recommend staying in the shade or wearing long sleeves, sun glasses (UVA+UVB protection) and wide brim hats (4-inch brim around the entire circumference of the hat). Call for new or changing lesions.   ACTINIC DAMAGE - chronic, secondary to cumulative UV radiation exposure/sun exposure over time - diffuse scaly erythematous macules with underlying dyspigmentation - Recommend daily broad spectrum sunscreen SPF 30+ to sun-exposed areas, reapply every 2 hours as needed.  - Recommend staying in the shade or wearing long sleeves, sun glasses (UVA+UVB protection) and wide brim hats (4-inch brim around the entire circumference of the hat). - Call for new or changing lesions.  Return in about 1 year (around 02/23/2024) for hx of Aks, post herpetic neuralgia .  IAngelique Holm, CMA, am acting as scribe for Armida Sans, MD .   Documentation: I have reviewed the above documentation for accuracy and completeness, and I agree with the above.  Armida Sans, MD

## 2023-02-23 NOTE — Patient Instructions (Signed)

## 2023-02-27 ENCOUNTER — Encounter: Payer: Self-pay | Admitting: Dermatology

## 2023-09-20 ENCOUNTER — Telehealth: Payer: Self-pay

## 2023-09-20 NOTE — Telephone Encounter (Signed)
 Patient called requesting refills for anti itch facial cream from [facility]. Rfs sent in. aw

## 2023-10-12 ENCOUNTER — Other Ambulatory Visit: Payer: Self-pay | Admitting: Physician Assistant

## 2023-10-12 DIAGNOSIS — Z1231 Encounter for screening mammogram for malignant neoplasm of breast: Secondary | ICD-10-CM

## 2023-10-13 ENCOUNTER — Other Ambulatory Visit: Payer: Self-pay | Admitting: Physician Assistant

## 2023-10-13 DIAGNOSIS — R600 Localized edema: Secondary | ICD-10-CM

## 2023-10-16 ENCOUNTER — Ambulatory Visit: Payer: Medicare HMO | Admitting: Dermatology

## 2023-10-16 ENCOUNTER — Encounter: Payer: Self-pay | Admitting: Dermatology

## 2023-10-16 DIAGNOSIS — D492 Neoplasm of unspecified behavior of bone, soft tissue, and skin: Secondary | ICD-10-CM

## 2023-10-16 DIAGNOSIS — L308 Other specified dermatitis: Secondary | ICD-10-CM | POA: Diagnosis not present

## 2023-10-16 DIAGNOSIS — K5792 Diverticulitis of intestine, part unspecified, without perforation or abscess without bleeding: Secondary | ICD-10-CM | POA: Insufficient documentation

## 2023-10-16 DIAGNOSIS — L570 Actinic keratosis: Secondary | ICD-10-CM

## 2023-10-16 DIAGNOSIS — M1990 Unspecified osteoarthritis, unspecified site: Secondary | ICD-10-CM | POA: Insufficient documentation

## 2023-10-16 DIAGNOSIS — E782 Mixed hyperlipidemia: Secondary | ICD-10-CM | POA: Insufficient documentation

## 2023-10-16 HISTORY — DX: Actinic keratosis: L57.0

## 2023-10-16 NOTE — Patient Instructions (Addendum)

## 2023-10-16 NOTE — Progress Notes (Signed)
   Follow-Up Visit   Subjective  Stacy Daniels is a 83 y.o. female who presents for the following: The patient has a itchy spot on her right upper leg for ~3 months that need to be evaluated.    The following portions of the chart were reviewed this encounter and updated as appropriate: medications, allergies, medical history  Review of Systems:  No other skin or systemic complaints except as noted in HPI or Assessment and Plan.  Objective  Well appearing patient in no apparent distress; mood and affect are within normal limits.  A focused examination was performed of the following areas:right upper leg, left leg, back,face    Relevant exam findings are noted in the Assessment and Plan.  Right Thigh - lateral Pink hyperemic thin plaque with telangiectasias, surrounding subtly hyperpigmented atrophic patch. Similar lesion on right dorsal foot. Atrophic skin on left face after zoster 5 years ago     Assessment & Plan     NEOPLASM OF SKIN Right Thigh - lateral Skin / nail biopsy Type of biopsy: tangential   Informed consent: discussed and consent obtained   Timeout: patient name, date of birth, surgical site, and procedure verified   Procedure prep:  Patient was prepped and draped in usual sterile fashion Prep type:  Isopropyl alcohol Anesthesia: the lesion was anesthetized in a standard fashion   Anesthetic:  1% lidocaine w/ epinephrine 1-100,000 buffered w/ 8.4% NaHCO3 Instrument used: DermaBlade   Hemostasis achieved with: pressure and aluminum chloride   Outcome: patient tolerated procedure well   Post-procedure details: sterile dressing applied and wound care instructions given   Dressing type: bandage and petrolatum   Specimen 1 - Surgical pathology Differential Diagnosis: ISK vs fixed drug eruption vs BCC vs morphea vs steroid atrophy vs radiation dermatitis  Check Margins: No  Return for next scheduled Dr Jodi Marble, CMA, am acting as scribe for  Elie Goody, MD .   Documentation: I have reviewed the above documentation for accuracy and completeness, and I agree with the above.  Elie Goody, MD

## 2023-10-18 LAB — SURGICAL PATHOLOGY

## 2023-10-19 ENCOUNTER — Telehealth: Payer: Self-pay

## 2023-10-19 ENCOUNTER — Ambulatory Visit
Admission: RE | Admit: 2023-10-19 | Discharge: 2023-10-19 | Disposition: A | Discharge status: Home / Self Care | Source: Ambulatory Visit | Attending: Physician Assistant | Admitting: Physician Assistant

## 2023-10-19 DIAGNOSIS — R600 Localized edema: Secondary | ICD-10-CM | POA: Insufficient documentation

## 2023-10-19 NOTE — Telephone Encounter (Signed)
 Left pt msg to call for bx results/sh

## 2023-10-19 NOTE — Telephone Encounter (Signed)
-----   Message from [place] sent at 10/19/2023  1:46 PM EDT ----- Diagnosis: right thigh - lateral :       ACTINIC KERATOSIS WITH SUPERIMPOSED SPONGIOTIC DERMATITIS    Plan: please call to share that biopsy shows a precancer with overlying eczema. Patient can make follow up for treatment or wait until 03/21/24 appointment with Dr Gwen Pounds

## 2023-10-20 ENCOUNTER — Encounter

## 2023-10-23 ENCOUNTER — Telehealth: Payer: Self-pay

## 2023-10-23 NOTE — Telephone Encounter (Signed)
 Discussed biopsy results with patient, she would like to wait until her appt in August to have the precancer treated.

## 2023-10-24 ENCOUNTER — Ambulatory Visit
Admission: RE | Admit: 2023-10-24 | Discharge: 2023-10-24 | Disposition: A | Discharge status: Home / Self Care | Source: Ambulatory Visit | Attending: Physician Assistant | Admitting: Physician Assistant

## 2023-10-24 DIAGNOSIS — Z1231 Encounter for screening mammogram for malignant neoplasm of breast: Secondary | ICD-10-CM | POA: Diagnosis present

## 2023-11-06 ENCOUNTER — Telehealth: Payer: Self-pay

## 2023-11-06 NOTE — Telephone Encounter (Signed)
 Patient called requesting a refill of [facility] Itching (Amitriptyline-Gabapentin-Lidocaine) rx sent to [facility]

## 2024-03-07 ENCOUNTER — Ambulatory Visit
Admission: RE | Admit: 2024-03-07 | Discharge: 2024-03-07 | Disposition: A | Discharge status: Home / Self Care | Source: Ambulatory Visit | Attending: Physician Assistant | Admitting: Physician Assistant

## 2024-03-07 ENCOUNTER — Other Ambulatory Visit: Payer: Self-pay | Admitting: Physician Assistant

## 2024-03-07 DIAGNOSIS — R609 Edema, unspecified: Secondary | ICD-10-CM | POA: Insufficient documentation

## 2024-03-08 ENCOUNTER — Other Ambulatory Visit: Payer: Self-pay | Admitting: Physician Assistant

## 2024-03-08 DIAGNOSIS — R600 Localized edema: Secondary | ICD-10-CM

## 2024-03-08 DIAGNOSIS — M25562 Pain in left knee: Secondary | ICD-10-CM

## 2024-03-21 ENCOUNTER — Encounter: Payer: Self-pay | Admitting: Dermatology

## 2024-03-21 ENCOUNTER — Ambulatory Visit: Payer: Medicare HMO | Admitting: Dermatology

## 2024-03-21 DIAGNOSIS — B0229 Other postherpetic nervous system involvement: Secondary | ICD-10-CM

## 2024-03-21 DIAGNOSIS — D692 Other nonthrombocytopenic purpura: Secondary | ICD-10-CM | POA: Diagnosis not present

## 2024-03-21 DIAGNOSIS — Z1283 Encounter for screening for malignant neoplasm of skin: Secondary | ICD-10-CM | POA: Diagnosis not present

## 2024-03-21 DIAGNOSIS — L821 Other seborrheic keratosis: Secondary | ICD-10-CM

## 2024-03-21 DIAGNOSIS — W908XXA Exposure to other nonionizing radiation, initial encounter: Secondary | ICD-10-CM

## 2024-03-21 DIAGNOSIS — L909 Atrophic disorder of skin, unspecified: Secondary | ICD-10-CM | POA: Diagnosis not present

## 2024-03-21 DIAGNOSIS — L700 Acne vulgaris: Secondary | ICD-10-CM

## 2024-03-21 DIAGNOSIS — L578 Other skin changes due to chronic exposure to nonionizing radiation: Secondary | ICD-10-CM

## 2024-03-21 DIAGNOSIS — L814 Other melanin hyperpigmentation: Secondary | ICD-10-CM

## 2024-03-21 NOTE — Patient Instructions (Signed)

## 2024-03-21 NOTE — Progress Notes (Signed)
   Follow-Up Visit   Subjective  Stacy Daniels is a 83 y.o. female who presents for the following: 12 months f/u on post herpetic neuralgia on her face treating [facility]  Amitriptyline: 5% Gabapentin:10%. Lidocaine : 5% cream with a good response, also using Voltaren and otc eye drop for eye symptoms  The patient presents for Upper Body Skin Exam (UBSE) for skin cancer screening and mole check.  The patient has spots, moles and lesions to be evaluated, some may be new or changing and the patient has concerns that these could be cancer.   The following portions of the chart were reviewed this encounter and updated as appropriate: medications, allergies, medical history  Review of Systems:  No other skin or systemic complaints except as noted in HPI or Assessment and Plan.  Objective  Well appearing patient in no apparent distress; mood and affect are within normal limits.  A focused examination was performed of the following areas: Skin waist up. Relevant exam findings are noted in the Assessment and Plan.         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  Her current treatment has helped, but still with symptoms. Chronic and persistent condition with duration or expected duration over one year. Condition is symptomatic / bothersome to patient. It is definitely improved, and medications definitely help, but Not to goal. Improved on current treatment. Patient was referred to pain clinic last year but was never contacted for an appointment, patient declines a new referral to the pain clinic    With some skin atrophy on L temple we will avoid topical steroid cream. See photos.   [facility] Amitriptyline: 5% Gabapentin: 10% Lidocaine : 5%  Purpura - Chronic; persistent and recurrent.  Treatable, but not curable. Left face from rubbing  - Violaceous  macules and patches - Benign - Related to trauma, age, sun damage and/or use of blood thinners, chronic use of topical and/or oral steroids - Observe - Can use OTC arnica containing moisturizer such as Dermend Bruise Formula if desired - Call for worsening or other concerns   ACTINIC DAMAGE - chronic, secondary to cumulative UV radiation exposure/sun exposure over time - diffuse scaly erythematous macules with underlying dyspigmentation - Recommend daily broad spectrum sunscreen SPF 30+ to sun-exposed areas, reapply every 2 hours as needed.  - Recommend staying in the shade or wearing long sleeves, sun glasses (UVA+UVB protection) and wide brim hats (4-inch brim around the entire circumference of the hat). - Call for new or changing lesions.   COMEDONE  Left upper back  - Benign-appearing - Discussed benign etiology and prognosis. - Observe - Call for any changes  SEBORRHEIC KERATOSIS - Stuck-on, waxy, tan-brown papules and/or plaques  - Benign-appearing - Discussed benign etiology and prognosis. - Observe - Call for any changes    LENTIGINES Exam: scattered tan macules Due to sun exposure Treatment Plan: Benign-appearing, observe. Recommend daily broad spectrum sunscreen SPF 30+ to sun-exposed areas, reapply every 2 hours as needed.  Call for any changes    Return in about 1 year (around 03/21/2025) for Post herpetic neuralgia.  IFay Kirks, CMA, am acting as scribe for Alm Rhyme, MD .   Documentation: I have reviewed the above documentation for accuracy and completeness, and I agree with the above.  Alm Rhyme, MD

## 2024-04-23 ENCOUNTER — Encounter (INDEPENDENT_AMBULATORY_CARE_PROVIDER_SITE_OTHER): Payer: Self-pay | Admitting: Vascular Surgery

## 2024-04-23 ENCOUNTER — Ambulatory Visit (INDEPENDENT_AMBULATORY_CARE_PROVIDER_SITE_OTHER): Admitting: Vascular Surgery

## 2024-04-23 VITALS — BP 135/67 | HR 66 | Wt 152.0 lb

## 2024-04-23 DIAGNOSIS — I10 Essential (primary) hypertension: Secondary | ICD-10-CM | POA: Diagnosis not present

## 2024-04-23 DIAGNOSIS — E782 Mixed hyperlipidemia: Secondary | ICD-10-CM | POA: Diagnosis not present

## 2024-04-23 DIAGNOSIS — M79662 Pain in left lower leg: Secondary | ICD-10-CM

## 2024-04-23 DIAGNOSIS — M7989 Other specified soft tissue disorders: Secondary | ICD-10-CM | POA: Diagnosis not present

## 2024-04-29 ENCOUNTER — Encounter (INDEPENDENT_AMBULATORY_CARE_PROVIDER_SITE_OTHER): Payer: Self-pay | Admitting: Vascular Surgery

## 2024-04-29 NOTE — Progress Notes (Signed)
 Subjective:    Patient ID: Stacy Daniels, female    DOB: [birth year], 83 y.o.   MRN: [id number] Chief Complaint  Patient presents with   Establish Care    Stacy Daniels is an 83 yo female that presents to clinic today with chief complaint of left knee pain and swelling for the past 3 to 4 months.  Patient endorses she was seen by orthopedics and was told that she had had some compartment syndrome to that knee.  She equates the knee pain with a compartment syndrome but she has always continued to have the swelling.  Patient also endorses that she has had swelling of her left ankle for years before any of the swelling had progressed.  Now the swelling has progressed up to her knee.  She has not tried any conventional therapy at this time.    Review of Systems  Constitutional: Negative.   Cardiovascular:  Positive for leg swelling.  Musculoskeletal:  Positive for myalgias.  All other systems reviewed and are negative.      Objective:   Physical Exam Vitals reviewed.  Constitutional:      Appearance: Normal appearance. She is normal weight.  HENT:     Head: Normocephalic.  Eyes:     Pupils: Pupils are equal, round, and reactive to light.  Cardiovascular:     Rate and Rhythm: Normal rate and regular rhythm.     Pulses: Normal pulses.     Heart sounds: Normal heart sounds.  Pulmonary:     Effort: Pulmonary effort is normal.     Breath sounds: Normal breath sounds.  Abdominal:     General: Abdomen is flat. Bowel sounds are normal.     Palpations: Abdomen is soft.  Musculoskeletal:        General: Swelling present.     Left lower leg: Edema present.     Comments: History of left lower extremity ankle swelling for years  Skin:    General: Skin is warm and dry.     Capillary Refill: Capillary refill takes 2 to 3 seconds.  Neurological:     General: No focal deficit present.     Mental Status: She is alert and oriented to person, place, and time. Mental status is at baseline.   Psychiatric:        Mood and Affect: Mood normal.        Behavior: Behavior normal.        Thought Content: Thought content normal.        Judgment: Judgment normal.     BP 135/67   Pulse 66   Wt 152 lb (68.9 kg)   BMI 25.29 kg/m   Past Medical History:  Diagnosis Date   Actinic keratosis 10/16/2023   Right Thigh - lateral, needs LN2   Arthritis    Chronic back pain    Hypercholesteremia     Social History   Socioeconomic History   Marital status: Married    Spouse name: Not on file   Number of children: Not on file   Years of education: Not on file   Highest education level: Not on file  Occupational History   Not on file  Tobacco Use   Smoking status: Never   Smokeless tobacco: Not on file  Substance and Sexual Activity   Alcohol use: No   Drug use: No   Sexual activity: Not on file  Other Topics Concern   Not on file  Social History Narrative   Not  on file   Social Drivers of Health   Financial Resource Strain: Low Risk  (10/11/2023)   Received from [facility]   Overall Financial Resource Strain (CARDIA)    Difficulty of Paying Living Expenses: Not hard at all  Food Insecurity: No Food Insecurity (10/11/2023)   Received from [facility]   Hunger Vital Sign    Within the past 12 months, you worried that your food would run out before you got the money to buy more.: Never true    Within the past 12 months, the food you bought just didn't last and you didn't have money to get more.: Never true  Transportation Needs: No Transportation Needs (10/11/2023)   Received from [facility] - Transportation    In the past 12 months, has lack of transportation kept you from medical appointments or from getting medications?: No    Lack of Transportation (Non-Medical): No  Physical Activity: Not on file  Stress: Not on file  Social Connections: Not on file  Intimate Partner Violence: Not on file     Past Surgical History:  Procedure Laterality Date   ABDOMINAL HYSTERECTOMY     BACK SURGERY     CHOLECYSTECTOMY     EYE SURGERY      Family History  Problem Relation Age of Onset   Breast cancer Other        30's    Allergies  Allergen Reactions   Amoxicillin Nausea Only   Duloxetine Other (See Comments)    Shaking, burning   Hydrochlorothiazide Other (See Comments)    Malaise    Other Nausea And Vomiting    An abx-pt does not remember name   Valtrex [Valacyclovir Hcl] Rash        No data to display            CMP  No results found for: NA, K, CL, CO2, GLUCOSE, BUN, CREATININE, CALCIUM, PROT, ALBUMIN, AST, ALT, ALKPHOS, BILITOT, GFR, EGFR, GFRNONAA   No results found.     Assessment & Plan:   1. Pain and swelling of lower leg, left (Primary) Recommend:  I have had a long discussion with the patient regarding swelling and why it  causes symptoms.  Patient will begin wearing graduated compression on a daily basis a prescription was given. The patient will  wear the stockings first thing in the morning and removing them in the evening. The patient is instructed specifically not to sleep in the stockings.   In addition, behavioral modification will be initiated.  This will include frequent elevation, use of over the counter pain medications and exercise such as walking.  Consideration for a lymph pump will also be made based upon the effectiveness of conservative therapy.  This would help to improve the edema control and prevent sequela such as ulcers and infections   Patient completed duplex ultrasound of the venous system to ensure that DVT or reflux is not present. Ultrasound from 10/19/23 was negative  The patient will follow-up with me in 4 months.    2. Benign essential HTN Continue antihypertensive medications as already ordered, these medications have been reviewed and there are no changes at this time.  3. Mixed  hyperlipidemia Continue statin as ordered and reviewed, no changes at this time   Current Outpatient Medications on File Prior to Visit  Medication Sig Dispense Refill   doxepin (SINEQUAN) 10 MG capsule TAKE 1 CAPSULE(10 MG) BY MOUTH EVERY NIGHT  erythromycin ophthalmic ointment Apply to eye.     gabapentin (NEURONTIN) 300 MG capsule Take 300 mg by mouth at bedtime.     hydrocortisone (ANUSOL-HC) 2.5 % rectal cream      hydrOXYzine  (ATARAX /VISTARIL ) 25 MG tablet  (Patient not taking: Reported on 01/26/2021)     Lidocaine , Anorectal, 5 % CREA Apply topically.     mometasone (ELOCON) 0.1 % cream APPLY TO THE AFFECTED AREA EVERY OTHER DAY/ 4 DAYS A WEEK AS DIRECTED 45 g 1   Multiple Vitamin (MULTI-VITAMIN) tablet Take by mouth.     Multiple Vitamins-Minerals (PRESERVISION AREDS) TABS      prednisoLONE acetate (PRED FORTE) 1 % ophthalmic suspension SHAKE LIQUID AND INSTILL 1 DROP IN LEFT EYE FOUR TIMES DAILY (Patient not taking: Reported on 01/26/2021)     predniSONE (DELTASONE) 10 MG tablet      Propylene Glycol 0.6 % SOLN Instill 3-5 times a day in each eye as needed for dryness.     rosuvastatin (CRESTOR) 10 MG tablet Take by mouth.     tacrolimus  (PROTOPIC ) 0.1 % ointment APPLY TOPICALLY TO THE AFFECTED AREA IN THE MORNING AND AT BEDTIME. DISCONTINUE MOMETASONE BEFORE USING 100 g 0   ZADITOR 0.025 % ophthalmic solution      No current facility-administered medications on file prior to visit.    There are no Patient Instructions on file for this visit. No follow-ups on file.   Gwendlyn JONELLE Shank, NP

## 2024-06-10 ENCOUNTER — Telehealth: Payer: Self-pay

## 2024-06-10 NOTE — Telephone Encounter (Signed)
 Patient called to let us  know [facility] will no longer be able to send her [facility] Amitriptyline: 5% Gabapentin: 10% Lidocaine : 5%  What should she use?

## 2024-06-11 ENCOUNTER — Telehealth: Payer: Self-pay

## 2024-06-11 NOTE — Telephone Encounter (Signed)
 Called patient LM on her VM  Try OTC Lidocaine  cream

## 2024-06-11 NOTE — Telephone Encounter (Signed)
 Patient called to let Dr MARLA know otc Lidocaine  does  not help, [facility] pharmacy recommended  Amitriptyline 5% / Lidocaine  5% / Pramoxine 1% Cream  Will this be okay

## 2024-06-11 NOTE — Telephone Encounter (Signed)
 Called [facility] the pharmacy needed Dr Katherleen DEA to fill the rx for  [facility] Amitriptyline: 5% Gabapentin: 10% Lidocaine : 5%  DEA # given to [facility] pharmacy Rx sent to [facility]

## 2024-08-26 ENCOUNTER — Ambulatory Visit (INDEPENDENT_AMBULATORY_CARE_PROVIDER_SITE_OTHER): Admitting: Vascular Surgery

## 2025-03-27 ENCOUNTER — Ambulatory Visit: Admitting: Dermatology
